# Patient Record
Sex: Female | Born: 1995 | Race: White | Hispanic: No | Marital: Single | Smoking: Never smoker
Health system: Southern US, Community
[De-identification: ages and names within clinical notes are randomized; demographics above are authoritative.]

## PROBLEM LIST (undated history)

## (undated) ENCOUNTER — Emergency Department: Admission: EM | Payer: Self-pay

## (undated) DIAGNOSIS — G43909 Migraine, unspecified, not intractable, without status migrainosus: Secondary | ICD-10-CM

## (undated) HISTORY — PX: NO PAST SURGERIES: SHX2092

## (undated) HISTORY — DX: Migraine, unspecified, not intractable, without status migrainosus: G43.909

---

## 2017-04-07 ENCOUNTER — Encounter: Payer: Self-pay | Admitting: Emergency Medicine

## 2017-04-07 ENCOUNTER — Emergency Department
Admission: EM | Admit: 2017-04-07 | Discharge: 2017-04-07 | Disposition: A | Discharge status: Home / Self Care | Payer: Self-pay | Attending: Emergency Medicine | Admitting: Emergency Medicine

## 2017-04-07 DIAGNOSIS — N10 Acute tubulo-interstitial nephritis: Secondary | ICD-10-CM | POA: Insufficient documentation

## 2017-04-07 LAB — URINALYSIS, ROUTINE W REFLEX MICROSCOPIC
Bilirubin Urine: NEGATIVE
Glucose, UA: NEGATIVE mg/dL
Hgb urine dipstick: NEGATIVE
KETONES UR: 20 mg/dL — AB
Nitrite: NEGATIVE
Protein, ur: 30 mg/dL — AB
Specific Gravity, Urine: 1.013 (ref 1.005–1.030)
pH: 6 (ref 5.0–8.0)

## 2017-04-07 MED ORDER — PHENAZOPYRIDINE HCL 95 MG PO TABS: 95.0000 mg | ORAL_TABLET | Freq: Three times a day (TID) | ORAL | 0 refills | Status: DC | PRN

## 2017-04-07 MED ORDER — CEFTRIAXONE SODIUM 1 G IJ SOLR
1.0000 g | Freq: Once | INTRAMUSCULAR | Status: AC
  Administered 2017-04-07: 1 g via INTRAMUSCULAR

## 2017-04-07 MED ORDER — CEFTRIAXONE SODIUM 1 G IJ SOLR
INTRAMUSCULAR | Status: AC
  Filled 2017-04-07: qty 10

## 2017-04-07 MED ORDER — CEPHALEXIN 500 MG PO CAPS: 500.0000 mg | ORAL_CAPSULE | Freq: Four times a day (QID) | ORAL | 0 refills | Status: DC

## 2017-04-07 NOTE — ED Provider Notes (Signed)
[facility] Emergency Department Provider Note  ____________________________________________  Time seen: Approximately 3:44 PM  I have reviewed the triage vital signs and the nursing notes.   HISTORY  Chief Complaint Urinary Tract Infection    HPI Maria Castillo is a 21 y.o. female who presents emergency department complaining of dysuria and hematuria. Patient reports that she has had symptoms for the past 7-10 days. She reports she has a history of UTIs in the past and states this feels the same. Patient reports suprapubic pain with urination and occasional intermittent bilateral flank pain. Patient reports that she has seen some blood in her urine. No history kidney stone. No history of diabetes. Patient is not trying medications for this complaint. No other complaints at this time.   History reviewed. No pertinent past medical history.  There are no active problems to display for this patient.   History reviewed. No pertinent surgical history.  Prior to Admission medications   Medication Sig Start Date End Date Taking? Authorizing Provider  cephALEXin (KEFLEX) 500 MG capsule Take 1 capsule (500 mg total) by mouth 4 (four) times daily. 04/07/17   Cuthriell, Delorise RoyalsJonathan D, PA-C  phenazopyridine (PYRIDIUM) 95 MG tablet Take 1 tablet (95 mg total) by mouth 3 (three) times daily as needed for pain. 04/07/17   Cuthriell, Delorise RoyalsJonathan D, PA-C    Allergies Patient has no known allergies.  No family history on file.  Social History Social History  Substance Use Topics  . Smoking status: Never Smoker  . Smokeless tobacco: Never Used  . Alcohol use No     Review of Systems  Constitutional: No fever/chills Eyes: No visual changes. No discharge ENT: No upper respiratory complaints. Cardiovascular: no chest pain. Respiratory: no cough. No SOB. Gastrointestinal: No abdominal pain.  No nausea, no vomiting.  No diarrhea.  No constipation. Genitourinary:  Positive for dysuria. Positive hematuria. Positive for suprapubic pain. Positive for intermittent flank pain. Musculoskeletal: Negative for musculoskeletal pain. Skin: Negative for rash, abrasions, lacerations, ecchymosis. Neurological: Negative for headaches, focal weakness or numbness. 10-point ROS otherwise negative.  ____________________________________________   PHYSICAL EXAM:  VITAL SIGNS: ED Triage Vitals [04/07/17 1442]  Enc Vitals Group     BP 137/80     Pulse Rate 98     Resp 18     Temp 98.2 F (36.8 C)     Temp Source Oral     SpO2 100 %     Weight 120 lb (54.4 kg)     Height 5\' 2"  (1.575 m)     Head Circumference      Peak Flow      Pain Score 6     Pain Loc      Pain Edu?      Excl. in GC?      Constitutional: Alert and oriented. Well appearing and in no acute distress. Eyes: Conjunctivae are normal. PERRL. EOMI. Head: Atraumatic. Neck: No stridor.    Cardiovascular: Normal rate, regular rhythm. Normal S1 and S2.  Good peripheral circulation. Respiratory: Normal respiratory effort without tachypnea or retractions. Lungs CTAB. Good air entry to the bases with no decreased or absent breath sounds. Gastrointestinal: Bowel sounds 4 quadrants. Soft to palpation.Patient has mild tenderness to palpation in suprapubic region, no other tenderness to palpation. No guarding or rigidity. No palpable masses. No distention. No CVA tenderness. Musculoskeletal: Full range of motion to all extremities. No gross deformities appreciated. Neurologic:  Normal speech and language. No gross focal neurologic deficits are  appreciated.  Skin:  Skin is warm, dry and intact. No rash noted. Psychiatric: Mood and affect are normal. Speech and behavior are normal. Patient exhibits appropriate insight and judgement.   ____________________________________________   LABS (all labs ordered are listed, but only abnormal results are displayed)  Labs Reviewed  URINALYSIS, ROUTINE W REFLEX  MICROSCOPIC - Abnormal; Notable for the following:       Result Value   Color, Urine YELLOW (*)    APPearance HAZY (*)    Ketones, ur 20 (*)    Protein, ur 30 (*)    Leukocytes, UA SMALL (*)    Bacteria, UA RARE (*)    Squamous Epithelial / LPF 6-30 (*)    All other components within normal limits   ____________________________________________  EKG   ____________________________________________  RADIOLOGY   No results found.  ____________________________________________    PROCEDURES  Procedure(s) performed:    Procedures    Medications  cefTRIAXone (ROCEPHIN) injection 1 g (not administered)     ____________________________________________   INITIAL IMPRESSION / ASSESSMENT AND PLAN / ED COURSE  Pertinent labs & imaging results that were available during my care of the patient were reviewed by me and considered in my medical decision making (see chart for details).  Review of the [place] CSRS was performed in accordance of the [facility] prior to dispensing any controlled drugs.     Patient's diagnosis is consistent with Pyelonephritis. Patient has had UTI symptoms 7-10 days. Patient reports intermittent flank pain with hematuria. Urinalysis returns with leukocytes and mild proteinuria and ketones. Patient denies any history of diabetes. No hematuria on urinalysis. On exam, patient is monitored palpation in the suprapubic region. She reports some mild discomfort intermittently in the flank region but this is not clinically correlated with CVA tenderness. Due to the length of symptoms, reported intermittent bilateral flank pain, patient will be diagnosed with pyelonephritis and will be treated with a gram of Rocephin in the emergency department and discharged home with oral antibiotics.. Patient will be discharged home with prescriptions for Keflex. Patient is to follow up with primary care after finishing oral antibiotics for repeat urinalysis to ensure clearance of ketones  and protein after UTI symptoms have resolved.  Patient is given ED precautions to return to the ED for any worsening or new symptoms.     ____________________________________________  FINAL CLINICAL IMPRESSION(S) / ED DIAGNOSES  Final diagnoses:  Acute pyelonephritis      NEW MEDICATIONS STARTED DURING THIS VISIT:  New Prescriptions   CEPHALEXIN (KEFLEX) 500 MG CAPSULE    Take 1 capsule (500 mg total) by mouth 4 (four) times daily.   PHENAZOPYRIDINE (PYRIDIUM) 95 MG TABLET    Take 1 tablet (95 mg total) by mouth 3 (three) times daily as needed for pain.        This chart was dictated using voice recognition software/Dragon. Despite best efforts to proofread, errors can occur which can change the meaning. Any change was purely unintentional.    Racheal Patches, PA-C 04/07/17 1621    Jeanmarie Plant, MD 04/07/17 2154

## 2017-04-07 NOTE — ED Triage Notes (Signed)
Burning with urination and lower abd pain with urination, has seen blood in her urine, has hx of uti, states this feels the same.

## 2017-04-07 NOTE — ED Notes (Signed)
Patient presents to the ED with lower back pain after having dysuria x 2 weeks.  Patient denies taking any antibiotics.  Patient is in no obvious distress at this time.  Patient reports one episode of vomiting yesterday and one episode of diarrhea daily for the past several days.

## 2017-11-03 ENCOUNTER — Other Ambulatory Visit: Payer: Self-pay | Admitting: Unknown Physician Specialty

## 2017-11-03 DIAGNOSIS — H9122 Sudden idiopathic hearing loss, left ear: Secondary | ICD-10-CM

## 2017-11-10 ENCOUNTER — Ambulatory Visit: Payer: BLUE CROSS/BLUE SHIELD

## 2017-11-19 ENCOUNTER — Other Ambulatory Visit: Payer: Self-pay | Admitting: Otolaryngology

## 2017-11-19 DIAGNOSIS — H9042 Sensorineural hearing loss, unilateral, left ear, with unrestricted hearing on the contralateral side: Secondary | ICD-10-CM

## 2017-11-19 DIAGNOSIS — IMO0001 Reserved for inherently not codable concepts without codable children: Secondary | ICD-10-CM

## 2017-11-24 ENCOUNTER — Ambulatory Visit
Admission: RE | Admit: 2017-11-24 | Discharge: 2017-11-24 | Disposition: A | Discharge status: Home / Self Care | Payer: BLUE CROSS/BLUE SHIELD | Source: Ambulatory Visit | Attending: Otolaryngology | Admitting: Otolaryngology

## 2017-11-24 DIAGNOSIS — IMO0001 Reserved for inherently not codable concepts without codable children: Secondary | ICD-10-CM

## 2017-11-24 DIAGNOSIS — H9042 Sensorineural hearing loss, unilateral, left ear, with unrestricted hearing on the contralateral side: Secondary | ICD-10-CM | POA: Insufficient documentation

## 2017-11-24 MED ORDER — GADOBENATE DIMEGLUMINE 529 MG/ML IV SOLN
10.0000 mL | Freq: Once | INTRAVENOUS | Status: AC | PRN
  Administered 2017-11-24: 10 mL via INTRAVENOUS

## 2018-09-03 LAB — HIV ANTIBODY (ROUTINE TESTING W REFLEX): HIV 1&2 Ab, 4th Generation: NONREACTIVE

## 2019-01-08 IMAGING — MR MR BRAIN/IAC WO/W
10 of 12 series · 40 of 48 positions shown · IV contrast (10 ML MULTIHANCE)
Comparison: None.

CLINICAL DATA: Left-sided hearing loss for 4 months.

EXAM:
MRI HEAD WITHOUT AND WITH CONTRAST
TECHNIQUE: Multiplanar, multiecho pulse sequences of the brain and surrounding
structures were obtained without and with intravenous contrast.
CONTRAST:  10mL MULTIHANCE GADOBENATE DIMEGLUMINE 529 MG/ML IV SOLN

[Series 2: T1 · sagittal · 5.0mm · 0.45mm/px · 4 of 23 slices shown (1 of 2)]
[im 1/23]
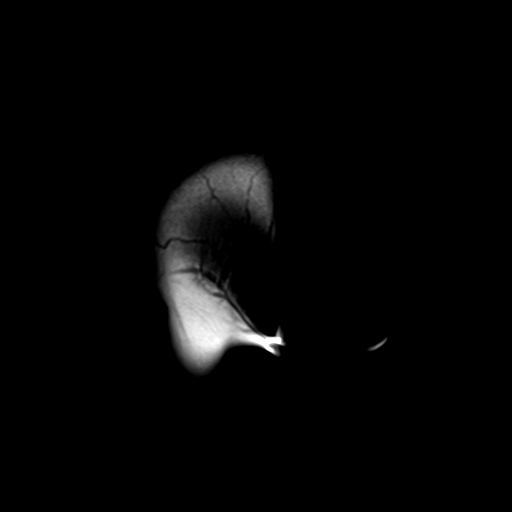
[im 8/23]
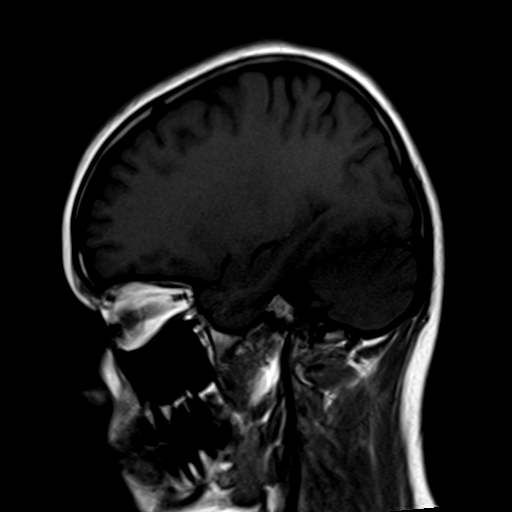
[im 15/23]
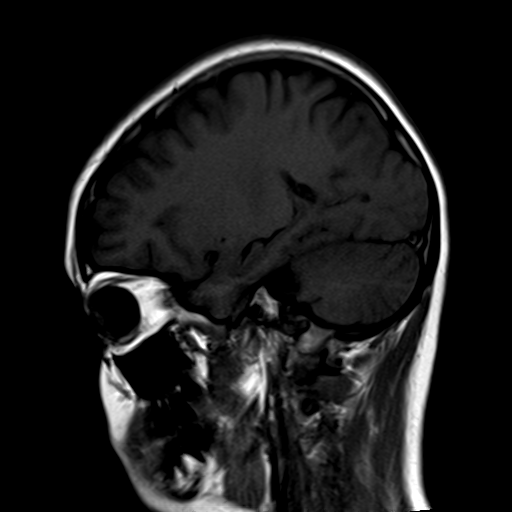
[im 23/23]
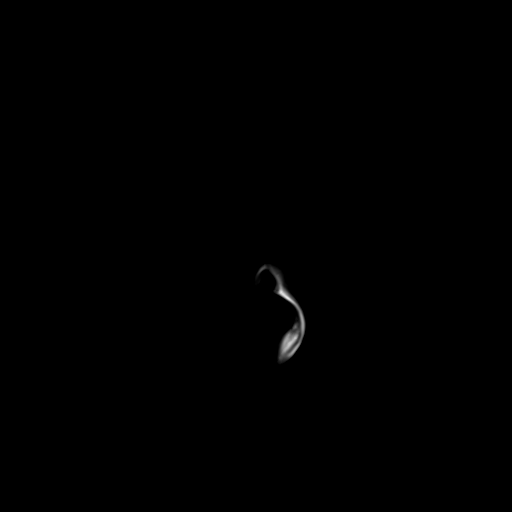

[Series 4: DWI · axial · 3.0mm · 1.20mm/px · z∈[-58,+99]mm · 6 of 54 slices shown (1 of 2)]
[im 1/54]
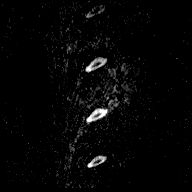
[im 11/54]
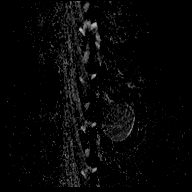
[im 22/54]
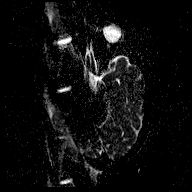
[im 32/54]
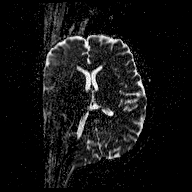
[im 43/54]
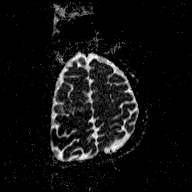
[im 54/54]
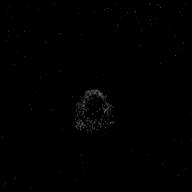

[Series 5: T2 · axial · 5.0mm · 0.72mm/px · z∈[-57,+99]mm · 3 of 24 slices shown (1 of 2)]
[im 1/24]
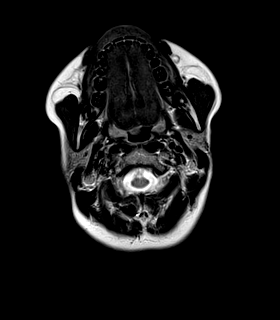
[im 12/24]
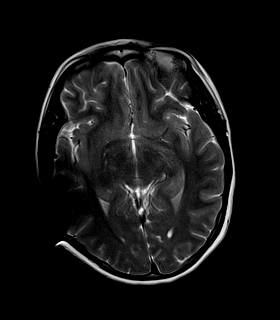
[im 24/24]
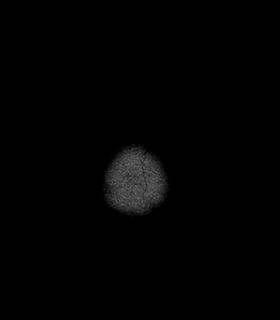

[Series 6: FLAIR · axial · 3.0mm · 0.45mm/px · z∈[-58,+99]mm · 7 of 55 slices shown]
[im 1/55]
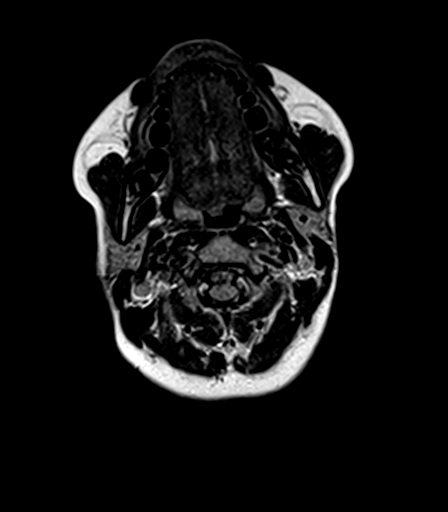
[im 10/55]
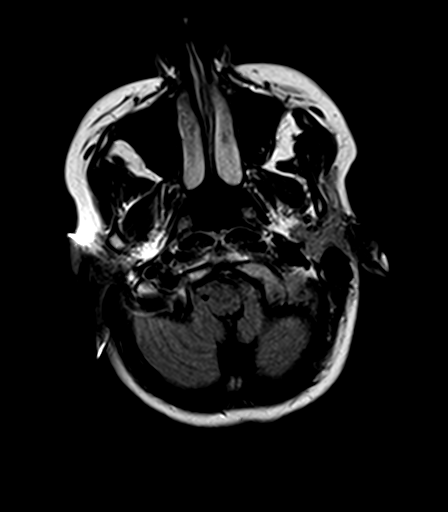
[im 19/55]
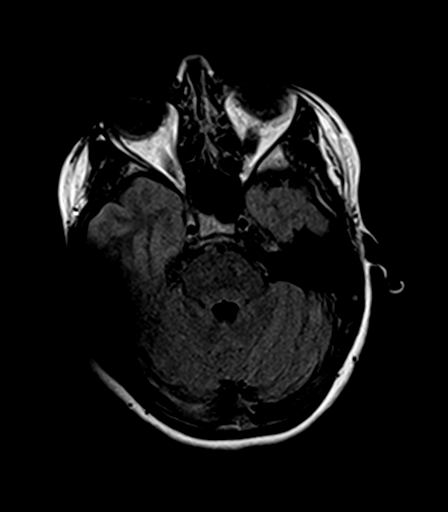
[im 28/55]
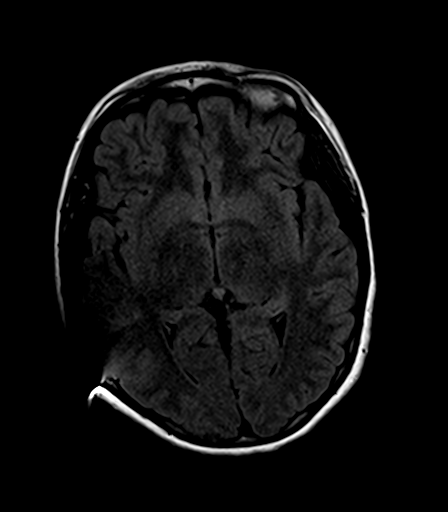
[im 37/55]
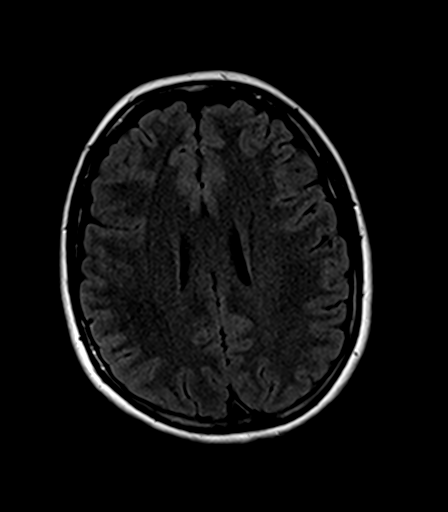
[im 46/55]
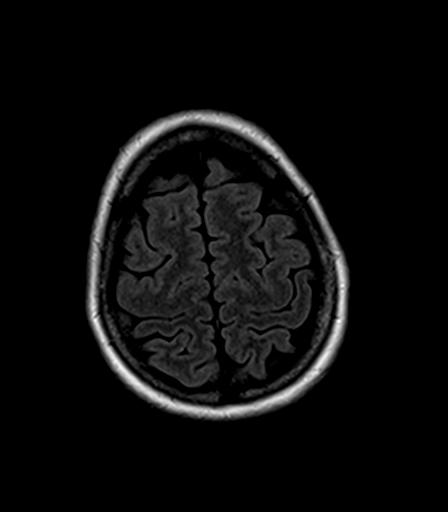
[im 55/55]
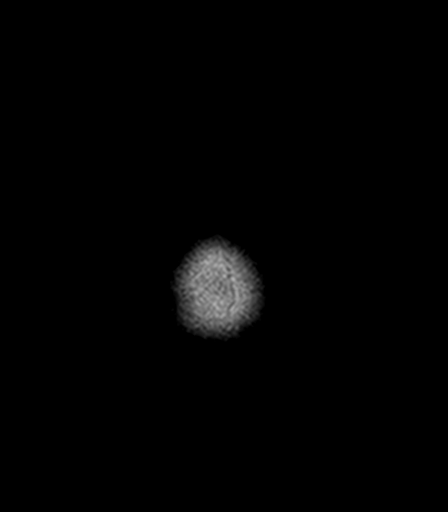

[Series 7: T2 · axial · 5.0mm · 0.72mm/px · z∈[-57,+99]mm · 3 of 24 slices shown (2 of 2)]
[im 1/24]
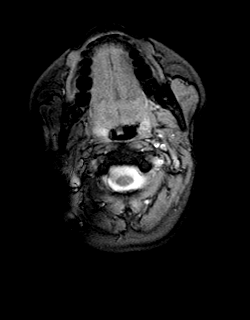
[im 12/24]
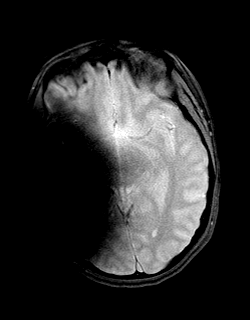
[im 24/24]
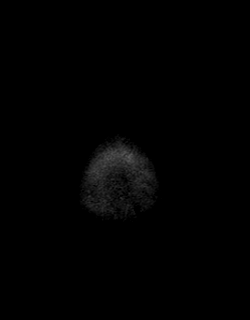

[Series 8: T1 · coronal · 3.0mm · 0.37mm/px · 1 of 11 slices shown (2 of 2)]
[im 1/11]
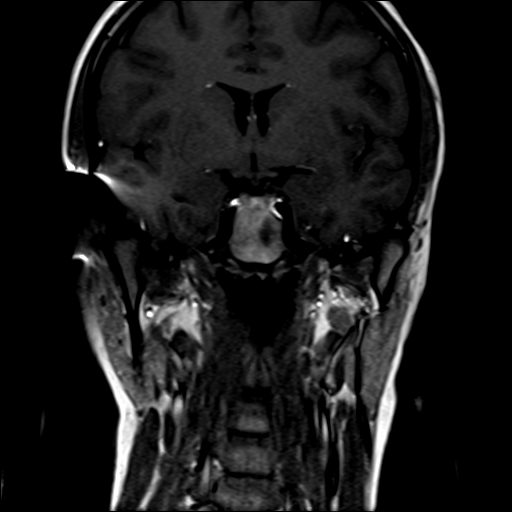

[Series 11: T1 post-contrast · axial · 3.0mm · 0.37mm/px · 1 of 11 slices shown (1 of 3)]
[im 1/11]
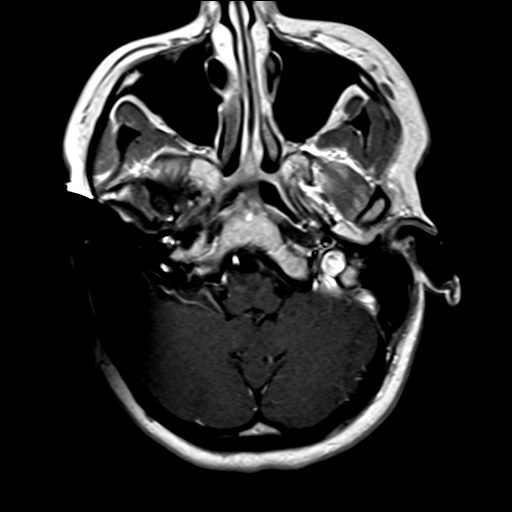

[Series 12: T1 post-contrast · coronal · 3.0mm · 0.37mm/px · 1 of 11 slices shown (2 of 3)]
[im 1/11]
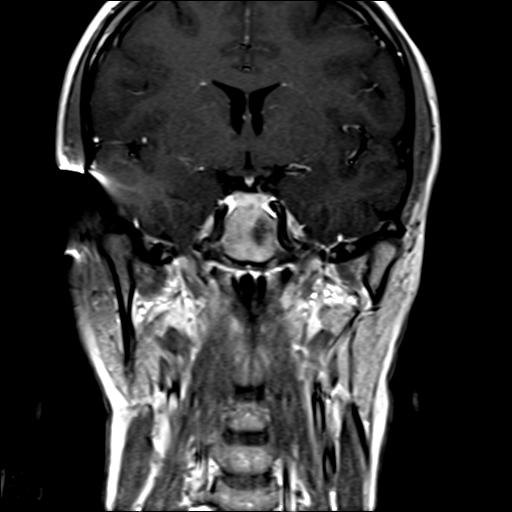

[Series 13: T1 post-contrast · axial · 3.0mm · 1.00mm/px · z∈[-74,+109]mm · 8 of 64 slices shown (3 of 3)]
[im 1/64]
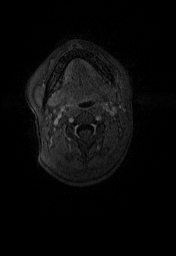
[im 10/64]
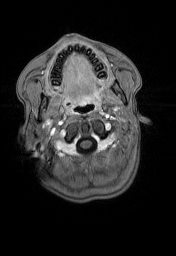
[im 19/64]
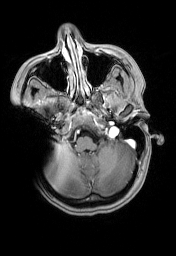
[im 28/64]
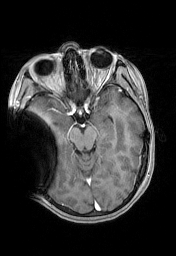
[im 37/64]
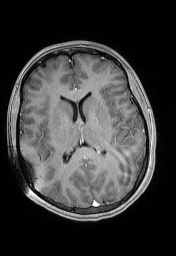
[im 46/64]
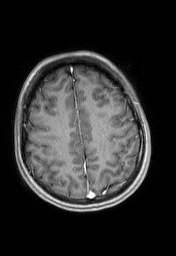
[im 55/64]
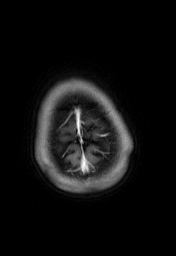
[im 64/64]
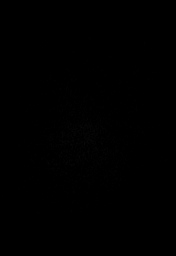

[Series 100: DWI · axial · 3.0mm · 1.20mm/px · z∈[-58,+99]mm · 6 of 53 slices shown (2 of 2)]
[im 1/53]
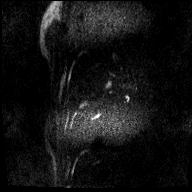
[im 11/53]
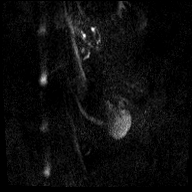
[im 21/53]
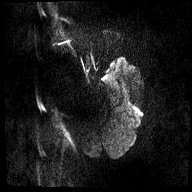
[im 32/53]
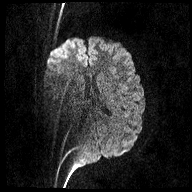
[im 42/53]
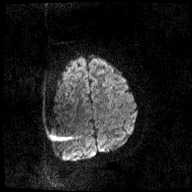
[im 53/53]
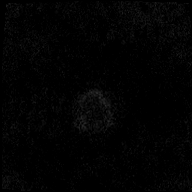

[40 of 48 positions shown; findings below may reference images not displayed]

FINDINGS: Brain: There is pervasive artifact on the right aspect of the scan
from a piercing that was reportedly unable to be removed. The right
temporal bone is not evaluated.

No explanation for left-sided hearing loss. Normal appearance of the
brainstem, cisterns, left vestibulocochlear nerves, and left
labyrinth. No enlargement of the left endolymphatic sac. No evidence
of demyelinating disease.

No infarct, blood products, hydrocephalus, or masslike finding.

Vascular: Major flow voids and vascular enhancements are preserved.

Skull and upper cervical spine: No evidence of marrow lesion.

Sinuses/Orbits: Negative
IMPRESSION: 1. Negative brain MRI. No explanation for left sensorineural hearing
loss.
2. Persistent artifact and obscuration involving the right aspect of
the scan due to a non removable piercing.

## 2019-04-29 ENCOUNTER — Ambulatory Visit: Payer: BLUE CROSS/BLUE SHIELD

## 2019-05-06 ENCOUNTER — Ambulatory Visit: Payer: Self-pay

## 2019-05-13 ENCOUNTER — Encounter: Payer: Self-pay | Admitting: Physician Assistant

## 2019-05-13 ENCOUNTER — Other Ambulatory Visit: Payer: Self-pay

## 2019-05-13 ENCOUNTER — Ambulatory Visit: Payer: Self-pay | Admitting: Physician Assistant

## 2019-05-13 DIAGNOSIS — Z113 Encounter for screening for infections with a predominantly sexual mode of transmission: Secondary | ICD-10-CM

## 2019-05-13 LAB — WET PREP FOR TRICH, YEAST, CLUE
Trichomonas Exam: NEGATIVE
Yeast Exam: NEGATIVE

## 2019-05-13 NOTE — Progress Notes (Signed)
Wet mount reviewed, no treatment indicated..Juwaun Inskeep Brewer-Jensen, RN 

## 2019-05-13 NOTE — Progress Notes (Signed)
    STI clinic/screening visit  Subjective:  Maria Castillo is a 23 y.o. female being seen today for an STI screening visit. The patient reports they do not have symptoms.  Patient has the following medical conditions:  There are no active problems to display for this patient.    Chief Complaint  Patient presents with  . SEXUALLY TRANSMITTED DISEASE    HPI  Patient reports that she does not have any symptoms but would like a screening today.   See flowsheet for further details and programmatic requirements.    The following portions of the patient's history were reviewed and updated as appropriate: allergies, current medications, past medical history, past social history, past surgical history and problem list.  Objective:  There were no vitals filed for this visit.  Physical Exam Constitutional:      General: She is not in acute distress.    Appearance: Normal appearance.  HENT:     Head: Normocephalic and atraumatic.     Mouth/Throat:     Mouth: Mucous membranes are moist.     Pharynx: Oropharynx is clear. No posterior oropharyngeal erythema.  Neck:     Musculoskeletal: Neck supple.  Pulmonary:     Effort: Pulmonary effort is normal.  Abdominal:     Palpations: Abdomen is soft. There is no mass.     Tenderness: There is no abdominal tenderness. There is no guarding or rebound.  Genitourinary:    General: Normal vulva.     Rectum: Normal.     Comments: External genitalia/pubic area normal female without nits, lice, edema, erythema, lesions and inguinal adenopathy. Vagina with normal mucosa and moderate amount of thin, menstrual bleeding present. Cervix without visible lesions. Uterus normal size, firm, mobile, nt, no masses, no CMT, no adnexal tenderness or fullness. Lymphadenopathy:     Cervical: No cervical adenopathy.  Skin:    General: Skin is warm.     Findings: No bruising, erythema, lesion or rash.  Neurological:     Mental Status: She is alert and  oriented to person, place, and time.  Psychiatric:        Mood and Affect: Mood normal.        Behavior: Behavior normal.        Thought Content: Thought content normal.        Judgment: Judgment normal.       Assessment and Plan:  Maria Castillo is a 23 y.o. female presenting to the [facility] Department for STI screening  1. Screening for STD (sexually transmitted disease) Patient is without symptoms today. Rec condoms with all sex Await test results.  Counseled that RN will call if she needs to RTC for any treatment once results are back.   - WET PREP FOR [place], YEAST, CLUE - Chlamydia/Gonorrhea [place] [facility] - HIV [place] [facility] - Syphilis Serology, [place] [facility] - Gonococcus culture     Return for prn.  No future appointments.  Jerene Dilling, PA

## 2019-05-13 NOTE — Progress Notes (Signed)
Patient here for STD testing. Using OC for BCM..Zakyia Gagan Brewer-Jensen, RN 

## 2019-05-18 LAB — GONOCOCCUS CULTURE

## 2019-11-18 ENCOUNTER — Ambulatory Visit: Payer: BC Managed Care – PPO | Admitting: Family Medicine

## 2019-11-18 ENCOUNTER — Ambulatory Visit: Payer: BC Managed Care – PPO

## 2019-11-18 ENCOUNTER — Other Ambulatory Visit: Payer: Self-pay

## 2019-11-18 ENCOUNTER — Other Ambulatory Visit: Payer: Self-pay | Admitting: Family Medicine

## 2019-11-18 ENCOUNTER — Encounter: Payer: Self-pay | Admitting: Family Medicine

## 2019-11-18 VITALS — BP 117/75 | Ht 62.75 in | Wt 126.0 lb

## 2019-11-18 DIAGNOSIS — Z3009 Encounter for other general counseling and advice on contraception: Secondary | ICD-10-CM

## 2019-11-18 DIAGNOSIS — G43909 Migraine, unspecified, not intractable, without status migrainosus: Secondary | ICD-10-CM | POA: Insufficient documentation

## 2019-11-18 DIAGNOSIS — Z113 Encounter for screening for infections with a predominantly sexual mode of transmission: Secondary | ICD-10-CM

## 2019-11-18 LAB — WET PREP FOR TRICH, YEAST, CLUE
Trichomonas Exam: NEGATIVE
Yeast Exam: NEGATIVE

## 2019-11-18 MED ORDER — NORETHINDRONE 0.35 MG PO TABS: 1.0000 | ORAL_TABLET | Freq: Every day | ORAL | 13 refills | Status: DC

## 2019-11-18 MED ORDER — NORETHINDRONE 0.35 MG PO TABS: 1.0000 | ORAL_TABLET | Freq: Every day | ORAL | 0 refills | Status: DC

## 2019-11-18 NOTE — Progress Notes (Signed)
Here today for FP/STD screen.  Needs pap and refill on Micronor. Richmond Campbell, RN

## 2019-11-18 NOTE — Progress Notes (Signed)
Family Planning Visit  Subjective:  Maria Castillo is a 24 y.o. being seen today for  Chief Complaint  Patient presents with  . Contraception  . SEXUALLY TRANSMITTED DISEASE    Screen    Pt has Migraines on their problem list.  HPI  Patient reports she would like refills of Micronor OCP. She stopped using for a few months and then restarted 3 wks ago, has missed no recent pills since restarting.   Pt denies any of the following contraindications to progesterone only contraceptive use: -Known or suspected pregnancy.  -Known or suspected breast cancer. -Undiagnosed abnormal uterine bleeding. -Benign or malignant liver tumors, severe cirrhosis, or acute liver disease. -Pt is also not taking anticonvulsants, nor has undergone malabsorptive bariatric surgeries.  Patient's last menstrual period was 11/11/2019 (approximate). Last sex: 2 wks ago BCM: micronor Pt desires EC? n/a  Last pap: never  Patient reports 2 partner(s) in last year. Do they desire STI screening (if no, why not)? yes  Does the patient desire a pregnancy in the next year? no   24 y.o., Body mass index is 22.5 kg/m. - Is patient eligible for HA1C diabetes screening based on BMI and age >72?  no  Does the patient have a current or past history of drug use? no No components found for: HCV  See flowsheet for other program required questions.   Health Maintenance Due  Topic Date Due  . TETANUS/TDAP  02/17/2017  . PAP-Cervical Cytology Screening  02/18/2019  . PAP SMEAR-Modifier  02/18/2019  . INFLUENZA VACCINE  05/21/2019    ROS  The following portions of the patient's history were reviewed and updated as appropriate: allergies, current medications, past family history, past medical history, past social history, past surgical history and problem list. Problem list updated.  Objective:  BP 117/75   Ht 5' 2.75" (1.594 m)   Wt 126 lb (57.2 kg)   LMP 11/11/2019 (Approximate)   BMI 22.50 kg/m     Physical Exam Vitals and nursing note reviewed.  Constitutional:      Appearance: Normal appearance.  HENT:     Head: Normocephalic and atraumatic.     Mouth/Throat:     Mouth: Mucous membranes are moist.     Pharynx: Oropharynx is clear. No oropharyngeal exudate or posterior oropharyngeal erythema.  Eyes:     Conjunctiva/sclera: Conjunctivae normal.  Cardiovascular:     Pulses: Normal pulses.  Pulmonary:     Effort: Pulmonary effort is normal.  Chest:     Breasts:        Right: Normal. No swelling, mass, nipple discharge, skin change or tenderness.        Left: Normal. No swelling, mass, nipple discharge, skin change or tenderness.  Abdominal:     General: Abdomen is flat.     Palpations: There is no mass.     Tenderness: There is no abdominal tenderness. There is no rebound.  Genitourinary:     General: Normal vulva.     Exam position: Lithotomy position.     Pubic Area: No rash or pubic lice.      Labia:        Right: No rash or lesion.        Left: No rash or lesion.      Vagina: Normal. No vaginal discharge, erythema, or lesions. +bright red period blood    Cervix: No cervical motion tenderness, discharge, friability, lesion or erythema.     Uterus: Normal.  Adnexa: Right adnexa normal and left adnexa normal.     Rectum: Normal.  Lymphadenopathy:     Head:     Right side of head: No preauricular or posterior auricular adenopathy.     Left side of head: No preauricular or posterior auricular adenopathy.     Cervical: No cervical adenopathy.     Upper Body:     Right upper body: No supraclavicular or axillary adenopathy.     Left upper body: No supraclavicular or axillary adenopathy.     Lower Body: No right inguinal adenopathy. No left inguinal adenopathy.  Skin:    General: Skin is warm and dry.     Findings: No rash.  Neurological:     Mental Status: She is alert and oriented to person, place, and time.      Assessment and Plan:  Maria Castillo  is a 24 y.o. female presenting to the [facility] Department for a well woman exam/family planning visit  Contraception counseling: Reviewed all forms of birth control options in the tiered based approach including abstinence; over the counter/barrier methods; hormonal contraceptive medication including pill, patch, ring, injection, contraceptive implant; hormonal and nonhormonal IUDs; permanent sterilization options including vasectomy and the various tubal sterilization modalities. Risks, benefits, how to discontinue and typical effectiveness rates were reviewed.  Questions were answered.  Written information was also given to the patient to review.  Patient desires OCP, this was prescribed for patient. She will follow up in  1 year for surveillance.  She was told to call with any further questions, or with any concerns about this method of contraception.  Emphasized use of condoms 100% of the time for STI prevention.  Emergency Contraception: n/a    1. Family planning services -Rx micronor x1 yr. Counseling as above.  -Pap today. She has never had a pap, no hx abnormal. - Pap IG ([facility]) - norethindrone (MICRONOR) 0.35 MG tablet; Take 1 tablet (0.35 mg total) by mouth daily.  Dispense: 13 Package; Refill: 0  2. Screening examination for venereal disease -Screenings today as below. Treat wet prep per standing order. -Patient does nto meet criteria for HepB, HepC Screening.  -Counseled on warning s/sx and when to seek care. Recommended condom use with all sex and discussed importance of condom use for STI prevention. - WET PREP FOR [place], YEAST, CLUE - HIV [place] [facility] - Syphilis Serology, [place] [facility] - Chlamydia/Gonorrhea [place] [facility]   Return in about 1 year (around 11/17/2020) for yearly wellness exam.  No future appointments.  Kandee Keen, PA-C

## 2019-11-18 NOTE — Progress Notes (Signed)
Wet mount reviewed. No tx per SO Diavian Furgason, RN  

## 2019-11-21 LAB — PAP IG (IMAGE GUIDED): PAP Smear Comment: 0

## 2020-04-06 ENCOUNTER — Ambulatory Visit: Payer: BC Managed Care – PPO

## 2020-04-13 ENCOUNTER — Other Ambulatory Visit: Payer: Self-pay

## 2020-04-13 ENCOUNTER — Ambulatory Visit: Payer: Self-pay | Admitting: Physician Assistant

## 2020-04-13 DIAGNOSIS — Z113 Encounter for screening for infections with a predominantly sexual mode of transmission: Secondary | ICD-10-CM

## 2020-04-13 LAB — WET PREP FOR TRICH, YEAST, CLUE
Trichomonas Exam: NEGATIVE
Yeast Exam: NEGATIVE

## 2020-04-13 NOTE — Progress Notes (Signed)
Wet Mount results reviewed. Per standing orders no treatment indicated. Mildreth Reek, RN  

## 2020-04-14 ENCOUNTER — Encounter: Payer: Self-pay | Admitting: Physician Assistant

## 2020-04-14 NOTE — Progress Notes (Signed)
[facility] Department STI clinic/screening visit  Subjective:  Maria Castillo is a 24 y.o. female being seen today for an STI screening visit. The patient reports they do not have symptoms.  Patient reports that they do not desire a pregnancy in the next year.   They reported they are not interested in discussing contraception today.  Patient's last menstrual period was 04/10/2020 (exact date).   Patient has the following medical conditions:   Patient Active Problem List   Diagnosis Date Noted  . Migraines 11/18/2019    Chief Complaint  Patient presents with  . SEXUALLY TRANSMITTED DISEASE    screening    HPI  Patient reports that she does not have any symptoms but would like a screening today.  Reports that she has migraine headaches that are without changes, has had no surgeries, takes MVI and OCs regularly, had her last pap and HIV testing 12/2019.  See flowsheet for further details and programmatic requirements.    The following portions of the patient's history were reviewed and updated as appropriate: allergies, current medications, past medical history, past social history, past surgical history and problem list.  Objective:  There were no vitals filed for this visit.  Physical Exam Constitutional:      General: She is not in acute distress.    Appearance: Normal appearance.  HENT:     Head: Normocephalic and atraumatic.     Comments: No nits, lice, or hair loss. No cervical, supraclavicular or axillary adenopathy.    Mouth/Throat:     Mouth: Mucous membranes are moist.     Pharynx: Oropharynx is clear. No oropharyngeal exudate or posterior oropharyngeal erythema.  Eyes:     Conjunctiva/sclera: Conjunctivae normal.  Pulmonary:     Effort: Pulmonary effort is normal.  Abdominal:     Palpations: Abdomen is soft. There is no mass.     Tenderness: There is no abdominal tenderness. There is no guarding or rebound.  Genitourinary:    General: Normal  vulva.     Rectum: Normal.     Comments: External genitalia/pubic area without nits, lice, edema, erythema, lesions and inguinal adenopathy. Vagina with normal mucosa and small amount of white discharge, pH=4.5. Cervix without visible lesions. Uterus firm, mobile, nt, no masses, no CMT, no adnexal tenderness or fullness. Musculoskeletal:     Cervical back: Neck supple. No tenderness.  Skin:    General: Skin is warm and dry.     Findings: No bruising, erythema, lesion or rash.  Neurological:     Mental Status: She is alert and oriented to person, place, and time.  Psychiatric:        Mood and Affect: Mood normal.        Behavior: Behavior normal.        Thought Content: Thought content normal.        Judgment: Judgment normal.      Assessment and Plan:  Maria Castillo is a 24 y.o. female presenting to the [facility] Department for STI screening  1. Screening for STD (sexually transmitted disease) Patient into clinic without symptoms. Rec condoms with all sex. Await test results.  Counseled that RN will call if needs to RTC for treatment once results are back. - WET PREP FOR [place], YEAST, CLUE - Gonococcus culture - Chlamydia/Gonorrhea [place] [facility] - HIV [place] [facility] - Syphilis Serology, [place] [facility]     No follow-ups on file.  No future appointments.  Matt Holmes, PA

## 2020-04-17 LAB — GONOCOCCUS CULTURE

## 2020-08-03 ENCOUNTER — Other Ambulatory Visit: Payer: Self-pay

## 2020-08-03 ENCOUNTER — Ambulatory Visit: Payer: Self-pay | Admitting: Family Medicine

## 2020-08-03 ENCOUNTER — Encounter: Payer: Self-pay | Admitting: Family Medicine

## 2020-08-03 DIAGNOSIS — Z113 Encounter for screening for infections with a predominantly sexual mode of transmission: Secondary | ICD-10-CM

## 2020-08-03 DIAGNOSIS — Z23 Encounter for immunization: Secondary | ICD-10-CM

## 2020-08-03 LAB — WET PREP FOR TRICH, YEAST, CLUE
Trichomonas Exam: NEGATIVE
Yeast Exam: NEGATIVE

## 2020-08-03 NOTE — Progress Notes (Signed)
[employer] results reviewed. Per standing orders no treatment indicated. Tdap given and tolerated well. Tawny Hopping, RN

## 2020-08-03 NOTE — Progress Notes (Signed)
  [facility] Department STI clinic/screening visit  Subjective:  Maria Castillo is a 24 y.o. female being seen today for  Chief Complaint  Patient presents with  . SEXUALLY TRANSMITTED DISEASE     The patient reports they do not have symptoms. Patient reports that they are not desire a pregnancy in the next year. They reported they are not interested in discussing contraception today.   Patient has the following medical conditions:   Patient Active Problem List   Diagnosis Date Noted  . Migraines 11/18/2019    HPI  Pt reports she is here for STI screening. Denies symptoms.  See flowsheet for further details and programmatic requirements.    No LMP recorded. Last sex: 1 day ago BCM: micronor Desires EC? n/a  Last pap per pt/review of record: 11/18/19 = NIL Last HIV test per pt/review of record: 03/2020  Last tetanus vaccine: 2010, agrees today Flu vaccine: declines   No components found for: HCV  The following portions of the patient's history were reviewed and updated as appropriate: allergies, current medications, past medical history, past social history, past surgical history and problem list.  Objective:  There were no vitals filed for this visit.   Physical Exam Vitals and nursing note reviewed.  Constitutional:      Appearance: Normal appearance.  HENT:     Head: Normocephalic and atraumatic.     Mouth/Throat:     Mouth: Mucous membranes are moist.     Pharynx: Oropharynx is clear. No oropharyngeal exudate or posterior oropharyngeal erythema.  Pulmonary:     Effort: Pulmonary effort is normal.  Abdominal:     General: Abdomen is flat.     Palpations: There is no mass.     Tenderness: There is no abdominal tenderness. There is no rebound.  Genitourinary:    Comments: Declines pelvic exam, prefers to self collect.  Lymphadenopathy:     Head:     Right side of head: No preauricular or posterior auricular adenopathy.     Left side of head:  No preauricular or posterior auricular adenopathy.     Cervical: No cervical adenopathy.     Upper Body:     Right upper body: No supraclavicular or axillary adenopathy.     Left upper body: No supraclavicular or axillary adenopathy.  Skin:    General: Skin is warm and dry.     Findings: No rash.  Neurological:     Mental Status: She is alert and oriented to person, place, and time.     Assessment and Plan:  Maria Castillo is a 24 y.o. female presenting to the [facility] Department for STI screening   1. Screening examination for venereal disease -Pt without symptoms. Screenings today as below. Treat wet prep per standing order. -Patient does not meet criteria for HepB, HepC Screening.  -Counseled on warning s/sx and when to seek care. Recommended condom use with all sex and discussed importance of condom use for STI prevention. - WET PREP FOR TRICH, YEAST, CLUE - Chlamydia/Gonorrhea [place] [facility] - HIV [place] [facility] - Syphilis Serology, [place] [facility] - Gonococcus culture  Pt requests tetanus vaccine today. RN notified, see separate documentation.   Return for screening as needed.  No future appointments.  Ann Held, PA-C

## 2020-08-07 LAB — GONOCOCCUS CULTURE

## 2020-10-04 ENCOUNTER — Ambulatory Visit: Payer: Self-pay

## 2020-10-05 ENCOUNTER — Ambulatory Visit: Payer: Self-pay

## 2020-10-10 ENCOUNTER — Encounter: Payer: Self-pay | Admitting: Physician Assistant

## 2020-10-10 ENCOUNTER — Ambulatory Visit: Payer: Self-pay | Admitting: Physician Assistant

## 2020-10-10 ENCOUNTER — Other Ambulatory Visit: Payer: Self-pay

## 2020-10-10 DIAGNOSIS — Z113 Encounter for screening for infections with a predominantly sexual mode of transmission: Secondary | ICD-10-CM

## 2020-10-10 LAB — WET PREP FOR TRICH, YEAST, CLUE
Trichomonas Exam: NEGATIVE
Yeast Exam: NEGATIVE

## 2020-10-10 NOTE — Progress Notes (Signed)
  [facility] Department STI clinic/screening visit  Subjective:  Maria Castillo is a 24 y.o. female being seen today for an STI screening visit. The patient reports they do not have symptoms.  Patient reports that they do not desire a pregnancy in the next year.   They reported they are not interested in discussing contraception today.  Patient's last menstrual period was 10/02/2020.   Patient has the following medical conditions:   Patient Active Problem List   Diagnosis Date Noted  . Migraines 11/18/2019    Chief Complaint  Patient presents with  . SEXUALLY TRANSMITTED DISEASE    screening    HPI  Patient reports that she is not having any symptoms but would like a screening today.  Denies chronic conditions and any surgeries.  Reports that she is using OCs as BCM, last HIV test was in 07/2020, and last pap was 10/2019.   See flowsheet for further details and programmatic requirements.    The following portions of the patient's history were reviewed and updated as appropriate: allergies, current medications, past medical history, past social history, past surgical history and problem list.  Objective:  There were no vitals filed for this visit.  Physical Exam Constitutional:      General: She is not in acute distress.    Appearance: Normal appearance.  HENT:     Head: Normocephalic and atraumatic.     Comments: No nits,lice, or hair loss. No cervical, supraclavicular or axillary adenopathy.    Mouth/Throat:     Mouth: Mucous membranes are moist.     Pharynx: Oropharynx is clear. No oropharyngeal exudate or posterior oropharyngeal erythema.  Eyes:     Conjunctiva/sclera: Conjunctivae normal.  Pulmonary:     Effort: Pulmonary effort is normal.  Musculoskeletal:     Cervical back: Neck supple. No tenderness.  Skin:    General: Skin is warm and dry.     Findings: No bruising, erythema, lesion or rash.  Neurological:     Mental Status: She is alert and  oriented to person, place, and time.  Psychiatric:        Mood and Affect: Mood normal.        Behavior: Behavior normal.        Thought Content: Thought content normal.        Judgment: Judgment normal.      Assessment and Plan:  Maria Castillo is a 24 y.o. female presenting to the [facility] Department for STI screening  1. Screening for STD (sexually transmitted disease) Patient into clinic without symptoms. Patient opts to self-collect vaginal samples for testing.  Counseled how to collect for accurate results.  Rec condoms with all sex. Await test results.  Counseled that RN will call if needs to RTC for treatment once results are back. - WET PREP FOR TRICH, YEAST, CLUE - Gonococcus culture - Chlamydia/Gonorrhea [place] [facility] - HIV [place] [facility] - Syphilis Serology, [place] [facility]     No follow-ups on file.  No future appointments.  Matt Holmes, PA

## 2020-10-10 NOTE — Progress Notes (Signed)
Post: °RN reviewed wet mount with patient. No tx needed per S.O. Provider orders complete.  ° °Elsie Baynes, RN ° °

## 2020-10-15 LAB — GONOCOCCUS CULTURE

## 2021-01-23 ENCOUNTER — Other Ambulatory Visit: Payer: Self-pay | Admitting: Family Medicine

## 2021-01-23 DIAGNOSIS — Z3009 Encounter for other general counseling and advice on contraception: Secondary | ICD-10-CM

## 2021-02-05 ENCOUNTER — Telehealth: Payer: Self-pay | Admitting: Family Medicine

## 2021-02-05 NOTE — Telephone Encounter (Signed)
Call to client who states needs prescription for additional ocps. Last physical at [facility] 10/2019. Client states will take last pill on Friday 02/08/2021 and period will start this weekend. Needs Thursday appt for physical as off day from work. No available appt 02/07/21 and appt scheduled for 02/14/2021 (scheduled in STI/FP slot). Jossie Ng, RN

## 2021-02-05 NOTE — Telephone Encounter (Signed)
Please call me in ref to my birth control.

## 2021-02-14 ENCOUNTER — Encounter: Payer: Self-pay | Admitting: Physician Assistant

## 2021-02-14 ENCOUNTER — Other Ambulatory Visit: Payer: Self-pay

## 2021-02-14 ENCOUNTER — Ambulatory Visit (LOCAL_COMMUNITY_HEALTH_CENTER): Payer: Self-pay | Admitting: Physician Assistant

## 2021-02-14 VITALS — BP 115/75 | Ht 62.0 in | Wt 142.0 lb

## 2021-02-14 DIAGNOSIS — Z3041 Encounter for surveillance of contraceptive pills: Secondary | ICD-10-CM

## 2021-02-14 DIAGNOSIS — Z113 Encounter for screening for infections with a predominantly sexual mode of transmission: Secondary | ICD-10-CM

## 2021-02-14 DIAGNOSIS — Z3009 Encounter for other general counseling and advice on contraception: Secondary | ICD-10-CM

## 2021-02-14 DIAGNOSIS — Z0000 Encounter for general adult medical examination without abnormal findings: Secondary | ICD-10-CM

## 2021-02-14 MED ORDER — NORETHINDRONE 0.35 MG PO TABS: 1.0000 | ORAL_TABLET | Freq: Every day | ORAL | 13 refills | Status: DC

## 2021-02-15 ENCOUNTER — Encounter: Payer: Self-pay | Admitting: Physician Assistant

## 2021-02-15 NOTE — Progress Notes (Signed)
Family Planning Visit- Repeat Yearly Visit  Subjective:  Maria Castillo is a 25 y.o. G0P0000  being seen today for an annual wellness visit and to discuss contraception options.   The patient is currently using Progesterone only pills for pregnancy prevention. Patient does not want a pregnancy in the next year. Patient has the following medical problems: has Migraines on their problem list.  Chief Complaint  Patient presents with  . Contraception    Annual exam and BCM    Patient reports that she is doing well with the current OC and has spotting 2-3 times a month.  Denies and increase or changes in her headaches and is followed by her PCP.  Per chart review, CBE due today and pap is due in 2024.  Patient denies any concerns today.    See flowsheet for other program required questions.   Body mass index is 25.97 kg/m. - Patient is eligible for diabetes screening based on BMI and age >55?  not applicable HA1C ordered? not applicable  Patient reports 3 of partners in last year. Desires STI screening?  Yes   Has patient been screened once for HCV in the past?  No  No results found for: HCVAB  Does the patient have current of drug use, have a partner with drug use, and/or has been incarcerated since last result? No  If yes-- Screen for HCV through [facility]   Does the patient meet criteria for HBV testing? No  Criteria:  -Household, sexual or needle sharing contact with HBV -History of drug use -HIV positive -Those with known Hep C   Health Maintenance Due  Topic Date Due  . Hepatitis C Screening  Never done  . COVID-19 Vaccine (1) Never done  . HPV VACCINES (1 - 2-dose series) Never done    Review of Systems  All other systems reviewed and are negative.   The following portions of the patient's history were reviewed and updated as appropriate: allergies, current medications, past family history, past medical history, past social history, past surgical history  and problem list. Problem list updated.  Objective:   Vitals:   02/14/21 0902  BP: 115/75  Weight: 142 lb (64.4 kg)  Height: 5\' 2"  (1.575 m)    Physical Exam Vitals and nursing note reviewed.  Constitutional:      General: She is not in acute distress.    Appearance: Normal appearance.  HENT:     Head: Normocephalic and atraumatic.     Mouth/Throat:     Mouth: Mucous membranes are moist.     Pharynx: Oropharynx is clear. No oropharyngeal exudate or posterior oropharyngeal erythema.  Eyes:     Conjunctiva/sclera: Conjunctivae normal.  Neck:     Thyroid: No thyroid mass, thyromegaly or thyroid tenderness.  Cardiovascular:     Rate and Rhythm: Normal rate and regular rhythm.  Pulmonary:     Effort: Pulmonary effort is normal.  Chest:  Breasts:     Right: Normal. No mass, nipple discharge, skin change, tenderness, axillary adenopathy or supraclavicular adenopathy.     Left: Normal. No mass, nipple discharge, skin change, tenderness, axillary adenopathy or supraclavicular adenopathy.    Abdominal:     Palpations: Abdomen is soft. There is no mass.     Tenderness: There is no abdominal tenderness. There is no guarding or rebound.  Musculoskeletal:     Cervical back: Neck supple. No tenderness.  Lymphadenopathy:     Cervical: No cervical adenopathy.  Upper Body:     Right upper body: No supraclavicular, axillary or pectoral adenopathy.     Left upper body: No supraclavicular, axillary or pectoral adenopathy.  Skin:    General: Skin is warm and dry.     Findings: No bruising, erythema, lesion or rash.  Neurological:     Mental Status: She is alert and oriented to person, place, and time.  Psychiatric:        Mood and Affect: Mood normal.        Behavior: Behavior normal.        Thought Content: Thought content normal.        Judgment: Judgment normal.       Assessment and Plan:  Maria Castillo is a 25 y.o. female G0P0000 presenting to the [facility] Department for an yearly wellness and contraception visit  Contraception counseling: Reviewed all forms of birth control options in the tiered based approach. available including abstinence; over the counter/barrier methods; hormonal contraceptive medication including pill, patch, ring, injection,contraceptive implant, ECP; hormonal and nonhormonal IUDs; permanent sterilization options including vasectomy and the various tubal sterilization modalities. Risks, benefits, and typical effectiveness rates were reviewed.  Questions were answered.  Written information was also given to the patient to review.  Patient desires to continue with her current OC, this was prescribed for patient. She will follow up in  1 year and prn for surveillance.  She was told to call with any further questions, or with any concerns about this method of contraception.  Emphasized use of condoms 100% of the time for STI prevention.  Patient was not a candidate for ECP today.    1. Encounter for counseling regarding contraception Reviewed with patient normal SE of current OC and when to call clinic. Enc condoms with all sex.  2. Screening for STD (sexually transmitted disease) Await test results.  Counseled that RN will call if needs to RTC for treatment once results are back.  - Chlamydia/Gonorrhea [place] [facility] - HIV/HCV [place] [facility] - Syphilis Serology, [place] [facility]  3. Well woman exam (no gynecological exam) Reviewed with patient healthy habits to maintain general health. Enc MVI 1 po daily. Enc to establish with/ follow up with PCP for primary care concerns, age appropriate screenings and illness.  4. Surveillance of previously prescribed contraceptive pill Norethindrone 28d 1 po daily at the same time each day, 1 yr supply given to patient. - norethindrone (MICRONOR) 0.35 MG tablet; Take 1 tablet (0.35 mg total) by mouth daily.  Dispense: 28 tablet; Refill: 13     Return in about 1 year (around  02/14/2022) for RP and prn.  No future appointments.  Matt Holmes, PA

## 2021-02-17 NOTE — Progress Notes (Signed)
Chart reviewed by Pharmacist  Suzanne Walker PharmD, Contract Pharmacist at [facility]  

## 2021-02-21 LAB — HM HEPATITIS C SCREENING LAB: HM Hepatitis Screen: NEGATIVE

## 2021-02-21 LAB — HM HIV SCREENING LAB: HM HIV Screening: NEGATIVE

## 2021-07-16 ENCOUNTER — Ambulatory Visit: Payer: Self-pay

## 2021-07-23 ENCOUNTER — Encounter: Payer: Self-pay | Admitting: Advanced Practice Midwife

## 2021-07-23 ENCOUNTER — Other Ambulatory Visit: Payer: Self-pay

## 2021-07-23 ENCOUNTER — Ambulatory Visit: Payer: Self-pay | Admitting: Advanced Practice Midwife

## 2021-07-23 DIAGNOSIS — Z113 Encounter for screening for infections with a predominantly sexual mode of transmission: Secondary | ICD-10-CM

## 2021-07-23 LAB — WET PREP FOR TRICH, YEAST, CLUE
Clue Cell Exam: POSITIVE — AB
Trichomonas Exam: NEGATIVE
Yeast Exam: NEGATIVE

## 2021-07-23 NOTE — Progress Notes (Signed)
[facility] Department STI clinic/screening visit  Subjective:  Maria Castillo is a 25 y.o. SWF nullip nonsmoker female being seen today for an STI screening visit. The patient reports they do not have symptoms.  Patient reports that they do not desire a pregnancy in the next year.   They reported they are not interested in discussing contraception today.  Patient's last menstrual period was 07/14/2021.   Patient has the following medical conditions:   Patient Active Problem List   Diagnosis Date Noted   Migraines 11/18/2019    Chief Complaint  Patient presents with   SEXUALLY TRANSMITTED DISEASE    screening    HPI  Patient reports asymptomatic. LMP 07/21/21. Last sex 07/18/21 without condom; with current partner off and on x 5 years; 2 sex partners in last 3 mo. Last ETOH 07/06/21 (2 shots liquor) 1-2x/mo.  Last HIV test per patient/review of record was 02/21/21 Patient reports last pap was 11/18/19 neg  See flowsheet for further details and programmatic requirements.    The following portions of the patient's history were reviewed and updated as appropriate: allergies, current medications, past medical history, past social history, past surgical history and problem list.  Objective:  There were no vitals filed for this visit.  Physical Exam Vitals and nursing note reviewed.  Constitutional:      Appearance: Normal appearance. She is normal weight.  HENT:     Head: Normocephalic and atraumatic.     Mouth/Throat:     Mouth: Mucous membranes are moist.     Pharynx: Oropharynx is clear. No oropharyngeal exudate or posterior oropharyngeal erythema.  Eyes:     Conjunctiva/sclera: Conjunctivae normal.  Pulmonary:     Effort: Pulmonary effort is normal.  Abdominal:     General: Abdomen is flat.     Palpations: Abdomen is soft. There is no mass.     Tenderness: There is no abdominal tenderness. There is no rebound.     Comments: Soft without masses or tenderness   Genitourinary:    General: Normal vulva.     Exam position: Lithotomy position.     Pubic Area: No rash or pubic lice.      Labia:        Right: No rash or lesion.        Left: No rash or lesion.      Vagina: Vaginal discharge (white creamy leukorrhea, ph<4.5) present. No erythema, bleeding or lesions.     Cervix: Normal.     Uterus: Normal.      Adnexa: Right adnexa normal and left adnexa normal.     Rectum: Normal.  Lymphadenopathy:     Head:     Right side of head: No preauricular or posterior auricular adenopathy.     Left side of head: No preauricular or posterior auricular adenopathy.     Cervical: No cervical adenopathy.     Right cervical: No superficial, deep or posterior cervical adenopathy.    Left cervical: No superficial, deep or posterior cervical adenopathy.     Upper Body:     Right upper body: No supraclavicular or axillary adenopathy.     Left upper body: No supraclavicular or axillary adenopathy.     Lower Body: No right inguinal adenopathy. No left inguinal adenopathy.  Skin:    General: Skin is warm and dry.     Findings: No rash.  Neurological:     Mental Status: She is alert and oriented to person, place, and time.  Assessment and Plan:  Maria Castillo is a 25 y.o. female presenting to the [facility] Department for STI screening  1. Screening examination for venereal disease Treat wet mount per standing orders Immunization nurse consult - WET PREP FOR TRICH, YEAST, CLUE - Gonococcus culture - Chlamydia/Gonorrhea [place] [facility] - HIV [place] [facility] - Syphilis Serology, [place] [facility]     No follow-ups on file.  No future appointments.  Alberteen Spindle, CNM

## 2021-07-23 NOTE — Progress Notes (Signed)
Pt here for STD screening.  Wet mount results reviewed, no treatment required per SO.  Maria Castillo Mikal Blasdell, RN ? ?

## 2021-07-28 LAB — GONOCOCCUS CULTURE

## 2021-08-27 ENCOUNTER — Ambulatory Visit: Payer: Self-pay

## 2022-01-07 ENCOUNTER — Encounter: Payer: Self-pay | Admitting: Nurse Practitioner

## 2022-01-07 ENCOUNTER — Ambulatory Visit: Payer: Self-pay | Admitting: Nurse Practitioner

## 2022-01-07 ENCOUNTER — Other Ambulatory Visit: Payer: Self-pay

## 2022-01-07 VITALS — BP 127/83 | Ht 62.0 in | Wt 148.8 lb

## 2022-01-07 DIAGNOSIS — Z113 Encounter for screening for infections with a predominantly sexual mode of transmission: Secondary | ICD-10-CM

## 2022-01-07 LAB — HM HIV SCREENING LAB: HM HIV Screening: NEGATIVE

## 2022-01-07 LAB — WET PREP FOR TRICH, YEAST, CLUE
Trichomonas Exam: NEGATIVE
Yeast Exam: NEGATIVE

## 2022-01-07 NOTE — Progress Notes (Signed)
Pt's appointment made for refill on birth control pills and an STD screening.   ?Pt states that she still has 2-3 packs of BCP's left.  She will need to return in May, to have a PE and get more refills for her BCP's. ?STD screening done by provider. ?Wet mount results reviewed, no treatment required per SO.  Pt declined condoms.  Berdie Ogren, RN ? ? ?

## 2022-01-07 NOTE — Progress Notes (Signed)
[facility] Department ? ?STI clinic/screening visit ?[address] ?[place] [address] ?[phone] ? ?Subjective:  ?KEYARA ENT is a 26 y.o. female being seen today for an STI screening visit. The patient reports they do not have symptoms.  Patient reports that they do not desire a pregnancy in the next year.   They reported they are not interested in discussing contraception today.   ? ?Patient's last menstrual period was 12/30/2021 (approximate). ? ? ?Patient has the following medical conditions:   ?Patient Active Problem List  ? Diagnosis Date Noted  ? Migraines 11/18/2019  ? ? ?Chief Complaint  ?Patient presents with  ? Contraception  ?  Birth control and STD check  ? ? ?HPI ? ?Patient reports to clinic for an STD screening.  Patient desired a physical and birth control.   ? ?Last HIV test per patient/review of record was 07/23/2021. ?Patient reports last pap was 11/15/2019.  ? ?Screening for MPX risk: ?Does the patient have an unexplained rash? No ?Is the patient MSM? No ?Does the patient endorse multiple sex partners or anonymous sex partners? No ?Did the patient have close or sexual contact with a person diagnosed with MPX? No ?Has the patient traveled outside the [place] where MPX is endemic? No ?Is there a high clinical suspicion for MPX-- evidenced by one of the following No ? -Unlikely to be chickenpox ? -Lymphadenopathy ? -Rash that present in same phase of evolution on any given body part ?See flowsheet for further details and programmatic requirements.  ? ? ?The following portions of the patient's history were reviewed and updated as appropriate: allergies, current medications, past medical history, past social history, past surgical history and problem list. ? ?Objective:  ? ?Vitals:  ? 01/07/22 1317  ?BP: 127/83  ?Weight: 148 lb 12.8 oz (67.5 kg)  ?Height: 5\' 2"  (1.575 m)  ? ? ?Physical Exam ?Constitutional:   ?   Appearance: Normal appearance.  ?HENT:  ?   Head:  Normocephalic.  ?   Right Ear: External ear normal.  ?   Left Ear: External ear normal.  ?   Nose: Nose normal.  ?   Mouth/Throat:  ?   Mouth: Mucous membranes are moist.  ?   Comments: No visible dental caries  ?Pulmonary:  ?   Effort: Pulmonary effort is normal.  ?   Breath sounds: Normal breath sounds.  ?Abdominal:  ?   General: Abdomen is flat.  ?   Palpations: Abdomen is soft.  ?Genitourinary: ?   Comments: External genitalia/pubic area without nits, lice, edema, erythema, lesions and inguinal adenopathy. ?Vagina with normal mucosa and discharge. ?Cervix without visible lesions. ?Uterus firm, mobile, nt, no masses, no CMT, no adnexal tenderness or fullness. pH 4.5. ?Musculoskeletal:  ?   Cervical back: Full passive range of motion without pain, normal range of motion and neck supple.  ?Skin: ?   General: Skin is warm and dry.  ?Neurological:  ?   Mental Status: She is alert and oriented to person, place, and time.  ?Psychiatric:     ?   Attention and Perception: Attention normal.     ?   Mood and Affect: Mood normal.     ?   Speech: Speech normal.     ?   Behavior: Behavior is cooperative.  ? ? ? ?Assessment and Plan:  ?ADELHEID HOGGARD is a 26 y.o. female presenting to the [facility] Department for STI screening ? ?1. Screening examination  for venereal disease ?-26 year old female in clinic today for STD screening.  Patient states she also wanted a physical and more birth control pills.  Patient informed physical not due until 02/17/22.  Will schedule an appointment close to that time.  Patient stated she still had an additional 2 packs of pills left.  Will continue with current packs and receive more at physical appointment.  STD screening provided today.  ?-Patient accepted all screenings including oral, vaginal CT/GC and bloodwork for HIV/RPR.  ?Patient meets criteria for HepB screening? No. Ordered? No - low risk ?Patient meets criteria for HepC screening? No. Ordered? No - low risk  ? ?Treat  wet prep per standing order ? ?Discussed time line for [facility] results and that patient will be called with positive results and encouraged patient to call if she had not heard in 2 weeks.  ?Counseled to return or seek care for continued or worsening symptoms ?Recommended condom use with all sex ? ?Patient is currently using Hormonal Contraception: Injection, Rings and Patches to prevent pregnancy.   ? ?- WET PREP FOR TRICH, YEAST, CLUE ?- Gonococcus culture ?- HIV [place] [facility] ?- Syphilis Serology, [place] [facility] ?- Chlamydia/Gonorrhea [place] [facility]  ? ? ? ? ?Return if symptoms worsen or fail to improve. ? ? ? ?Glenna Fellows, FNP ?

## 2022-01-12 LAB — GONOCOCCUS CULTURE

## 2022-01-14 DIAGNOSIS — Z309 Encounter for contraceptive management, unspecified: Secondary | ICD-10-CM

## 2022-03-31 ENCOUNTER — Ambulatory Visit: Payer: Self-pay

## 2022-04-07 ENCOUNTER — Encounter: Payer: Self-pay | Admitting: Nurse Practitioner

## 2022-04-07 ENCOUNTER — Ambulatory Visit (LOCAL_COMMUNITY_HEALTH_CENTER): Payer: Self-pay | Admitting: Nurse Practitioner

## 2022-04-07 VITALS — BP 121/78 | Ht 62.0 in | Wt 151.8 lb

## 2022-04-07 DIAGNOSIS — B9689 Other specified bacterial agents as the cause of diseases classified elsewhere: Secondary | ICD-10-CM

## 2022-04-07 DIAGNOSIS — Z113 Encounter for screening for infections with a predominantly sexual mode of transmission: Secondary | ICD-10-CM

## 2022-04-07 DIAGNOSIS — Z01419 Encounter for gynecological examination (general) (routine) without abnormal findings: Secondary | ICD-10-CM

## 2022-04-07 DIAGNOSIS — N760 Acute vaginitis: Secondary | ICD-10-CM

## 2022-04-07 DIAGNOSIS — Z3009 Encounter for other general counseling and advice on contraception: Secondary | ICD-10-CM

## 2022-04-07 LAB — WET PREP FOR TRICH, YEAST, CLUE
Trichomonas Exam: NEGATIVE
Yeast Exam: NEGATIVE

## 2022-04-07 LAB — HM HIV SCREENING LAB: HM HIV Screening: NEGATIVE

## 2022-04-07 MED ORDER — METRONIDAZOLE 500 MG PO TABS: 500.0000 mg | ORAL_TABLET | Freq: Two times a day (BID) | ORAL | 0 refills | Status: DC

## 2022-04-07 NOTE — Progress Notes (Signed)
Patient seen for PE, STD screening. Wet prep reviewed, tx given per standing orders. Condoms declined.

## 2022-04-07 NOTE — Progress Notes (Signed)
[facility] DEPARTMENT [facility] [address] Main Number: [phone]    Family Planning Visit- Initial Visit  Subjective:  Maria Castillo is a 26 y.o.  [id number]   being seen today for an initial annual visit and to discuss reproductive life planning.  The patient is currently using Female Condom for pregnancy prevention. Patient reports   does want a pregnancy in the next year.     report they are looking for a method that provides Other mat desire pregnancy within the next year.   Patient has the following medical conditions has Migraines on their problem list.  Chief Complaint  Patient presents with   Annual Exam    Patient reports to clinic today for a physical and STD screening.    Body mass index is 27.76 kg/m. - Patient is eligible for diabetes screening based on BMI and age >52?  not applicable HA1C ordered? not applicable  Patient reports 3  partner/s in last year. Desires STI screening?  Yes  Has patient been screened once for HCV in the past?  No  No results found for: "HCVAB"  Does the patient have current drug use (including MJ), have a partner with drug use, and/or has been incarcerated since last result? No  If yes-- Screen for HCV through [facility]   Does the patient meet criteria for HBV testing? No  Criteria:  -Household, sexual or needle sharing contact with HBV -History of drug use -HIV positive -Those with known Hep C   Health Maintenance Due  Topic Date Due   COVID-19 Vaccine (1) Never done    Review of Systems  Constitutional:  Negative for chills, fever, malaise/fatigue and weight loss.  HENT:  Negative for congestion, hearing loss and sore throat.   Eyes:  Negative for blurred vision, double vision and photophobia.  Respiratory:  Negative for shortness of breath.   Cardiovascular:  Negative for chest pain.  Gastrointestinal:  Negative for abdominal pain, blood in stool, constipation, diarrhea,  heartburn, nausea and vomiting.  Genitourinary:  Negative for dysuria and frequency.       Discharge   Musculoskeletal:  Negative for back pain, joint pain and neck pain.  Skin:  Negative for itching and rash.  Neurological:  Negative for dizziness, weakness and headaches.  Endo/Heme/Allergies:  Does not bruise/bleed easily.  Psychiatric/Behavioral:  Negative for depression, substance abuse and suicidal ideas.     The following portions of the patient's history were reviewed and updated as appropriate: allergies, current medications, past family history, past medical history, past social history, past surgical history and problem list. Problem list updated.   See flowsheet for other program required questions.  Objective:   Vitals:   04/07/22 1052  BP: 121/78  Weight: 151 lb 12.8 oz (68.9 kg)  Height: 5\' 2"  (1.575 m)    Physical Exam Constitutional:      Appearance: Normal appearance.  HENT:     Head: Normocephalic. No abrasion, masses or laceration. Hair is normal.     Jaw: No tenderness or swelling.     Right Ear: External ear normal.     Left Ear: External ear normal.     Nose: Nose normal.     Mouth/Throat:     Lips: Pink. No lesions.     Mouth: Mucous membranes are moist. No lacerations or oral lesions.     Dentition: No dental caries.     Tongue: No lesions.     Palate: No  mass and lesions.     Pharynx: No pharyngeal swelling, oropharyngeal exudate, posterior oropharyngeal erythema or uvula swelling.     Tonsils: No tonsillar exudate or tonsillar abscesses.  Eyes:     Pupils: Pupils are equal, round, and reactive to light.  Neck:     Thyroid: No thyroid mass, thyromegaly or thyroid tenderness.  Cardiovascular:     Rate and Rhythm: Normal rate and regular rhythm.  Pulmonary:     Effort: Pulmonary effort is normal.     Breath sounds: Normal breath sounds.  Abdominal:     General: Abdomen is flat. Bowel sounds are normal.     Palpations: Abdomen is soft.      Tenderness: There is no abdominal tenderness. There is no rebound.  Genitourinary:    Pubic Area: No rash or pubic lice.      Labia:        Right: No rash, tenderness or lesion.        Left: No rash, tenderness or lesion.      Vagina: Normal. No vaginal discharge, erythema, tenderness or lesions.     Cervix: No cervical motion tenderness, discharge, lesion or erythema.     Uterus: Normal.      Adnexa:        Right: No tenderness.         Left: No tenderness.       Rectum: Normal.     Comments: Amount Discharge: small  Odor: No pH: greater than 4.5 Adheres to vaginal wall: No Color: color of discharge matches the Noriko Macari swab  Musculoskeletal:     Cervical back: Full passive range of motion without pain and normal range of motion.  Lymphadenopathy:     Cervical: No cervical adenopathy.     Right cervical: No superficial, deep or posterior cervical adenopathy.    Left cervical: No superficial, deep or posterior cervical adenopathy.     Upper Body:     Right upper body: No supraclavicular, axillary or epitrochlear adenopathy.     Left upper body: No supraclavicular, axillary or epitrochlear adenopathy.     Lower Body: No right inguinal adenopathy. No left inguinal adenopathy.  Skin:    General: Skin is warm and dry.     Findings: No erythema, laceration, lesion or rash.  Neurological:     Mental Status: She is alert and oriented to person, place, and time.  Psychiatric:        Attention and Perception: Attention normal.        Mood and Affect: Mood normal.        Speech: Speech normal.        Behavior: Behavior normal. Behavior is cooperative.      Assessment and Plan:  Maria Castillo is a 26 y.o. female presenting to the [facility] Department for an initial annual wellness/contraceptive visit  Contraception counseling: Reviewed options based on patient desire and reproductive life plan. Patient is interested in Female Condom. This was provided to the patient  today.   Risks, benefits, and typical effectiveness rates were reviewed.  Questions were answered.  Written information was also given to the patient to review.    The patient will follow up in  1 years for surveillance.  The patient was told to call with any further questions, or with any concerns about this method of contraception.  Emphasized use of condoms 100% of the time for STI prevention.  Need for ECP was assessed.  ECP not offered due to  patient desiring pregnancy.  1. Family planning counseling -26 year old female in clinic today for a physical and STD screening.  -ROS reviewed.  Patient reports discharge since May and desires STD screening. -Patient desires to continue with condoms and may desire pregnancy within the next year.    2. Well woman exam with routine gynecological exam -Normal well woman exam. -CBE done in 2022, next due 2025 -PAP done 10/2019, next due 10/2022  3. Screening examination for venereal disease -STD screening today. -Patient accepted all screenings including oral, vaginal CT/GC and bloodwork for HIV/RPR.  Patient meets criteria for HepB screening? No. Ordered? No - low risk Patient meets criteria for HepC screening? No. Ordered? No - low risk   Treat wet prep per standing order Discussed time line for State Lab results and that patient will be called with positive results and encouraged patient to call if she had not heard in 2 weeks.  Counseled to return or seek care for continued or worsening symptoms Recommended condom use with all sex  Patient is currently using  condoms   to prevent pregnancy.    - Chlamydia/Gonorrhea [place] [facility] - Chlamydia/Gonorrhea [place] [facility] - HIV [place] [facility] - Syphilis Serology,  Lab - WET PREP FOR TRICH, YEAST, CLUE   4. Bacterial vaginosis Treat patient for BV.    - metroNIDAZOLE (FLAGYL) 500 MG tablet; Take 1 tablet (500 mg total) by mouth 2 (two) times daily.  Dispense: 14 tablet; Refill: 0     Return in about 1 year (around 04/08/2023) for Annual well-woman exam.  Glenna Fellows, FNP

## 2022-04-08 ENCOUNTER — Encounter: Payer: Self-pay | Admitting: Nurse Practitioner

## 2022-09-19 ENCOUNTER — Ambulatory Visit: Payer: Self-pay

## 2022-10-08 ENCOUNTER — Ambulatory Visit: Payer: Self-pay | Admitting: Family Medicine

## 2022-10-08 ENCOUNTER — Encounter: Payer: Self-pay | Admitting: Family Medicine

## 2022-10-08 DIAGNOSIS — Z113 Encounter for screening for infections with a predominantly sexual mode of transmission: Secondary | ICD-10-CM

## 2022-10-08 LAB — WET PREP FOR TRICH, YEAST, CLUE
Trichomonas Exam: NEGATIVE
Yeast Exam: NEGATIVE

## 2022-10-08 LAB — HM HIV SCREENING LAB: HM HIV Screening: NEGATIVE

## 2022-10-08 NOTE — Progress Notes (Signed)
Pt. seen for routine STI screening. Gram results reviewed with pt by [facility] FNP. Condoms declined, no tx indicated.

## 2022-10-08 NOTE — Progress Notes (Signed)
[facility] Department  STI clinic/screening visit [place] [address] [phone]  Subjective:  Maria Castillo is a 26 y.o. female being seen today for an STI screening visit. The patient reports they do not have symptoms.  Patient reports that they do not desire a pregnancy in the next year.   They reported they are not interested in discussing contraception today.    Patient's last menstrual period was 10/03/2022.   Patient has the following medical conditions:   Patient Active Problem List   Diagnosis Date Noted   Migraines 11/18/2019    Chief Complaint  Patient presents with   SEXUALLY TRANSMITTED DISEASE    STI screening    HPI  Patient reports to clinic for STI screening.   Does the patient using douching products? No  Last HIV test per patient/review of record was  Lab Results  Component Value Date   HMHIVSCREEN Negative - Validated 04/07/2022    Lab Results  Component Value Date   HIV Non reactive 09/03/2018   Patient reports last pap was 10/2019  Screening for MPX risk: Does the patient have an unexplained rash? No Is the patient MSM? No Does the patient endorse multiple sex partners or anonymous sex partners? No Did the patient have close or sexual contact with a person diagnosed with MPX? No Has the patient traveled outside the [place] where MPX is endemic? No Is there a high clinical suspicion for MPX-- evidenced by one of the following No  -Unlikely to be chickenpox  -Lymphadenopathy  -Rash that present in same phase of evolution on any given body part See flowsheet for further details and programmatic requirements.   Immunization history:  Immunization History  Administered Date(s) Administered   Hepatitis A 10/25/2010, 11/26/2011   Hepatitis B November 09, 1997, 03/29/1998, 01/08/1999   Hpv-Unspecified 07/10/2009, 10/25/2010, 11/26/2011   MMR 12/03/1999, 02/08/2003   Meningococcal Conjugate 07/10/2009, 08/07/2014    Tdap 08/03/2020   Varicella 12/03/1999, 07/10/2009     The following portions of the patient's history were reviewed and updated as appropriate: allergies, current medications, past medical history, past social history, past surgical history and problem list.  Objective:  There were no vitals filed for this visit.  Physical Exam Vitals and nursing note reviewed.  Constitutional:      Appearance: Normal appearance.  HENT:     Head: Normocephalic and atraumatic.     Mouth/Throat:     Mouth: Mucous membranes are moist.     Pharynx: Oropharynx is clear. No oropharyngeal exudate or posterior oropharyngeal erythema.  Pulmonary:     Effort: Pulmonary effort is normal.  Abdominal:     General: Abdomen is flat.     Palpations: There is no mass.     Tenderness: There is no abdominal tenderness. There is no rebound.  Genitourinary:    Comments: Patient declined genital exam- wanted to self swab. Lymphadenopathy:     Head:     Right side of head: No preauricular or posterior auricular adenopathy.     Left side of head: No preauricular or posterior auricular adenopathy.     Cervical: No cervical adenopathy.     Upper Body:     Right upper body: No supraclavicular, axillary or epitrochlear adenopathy.     Left upper body: No supraclavicular, axillary or epitrochlear adenopathy.  Skin:    General: Skin is warm and dry.     Findings: No rash.  Neurological:     Mental Status: She is  alert and oriented to person, place, and time.      Assessment and Plan:  Maria Castillo is a 26 y.o. female presenting to the [facility] Department for STI screening  1. Screening for venereal disease  - HIV [place] [facility] - Syphilis Serology, [place], [place], CLUE   Patient accepted all screenings including oral, vaginal CT/GC and bloodwork for HIV/RPR, and wet prep. Patient meets criteria for HepB screening? No. Ordered?  No - not indicated Patient meets criteria for HepC screening? No. Ordered? No - not indicated  Treat wet prep per standing order Discussed time line for State Lab results and that patient will be called with positive results and encouraged patient to call if she had not heard in 2 weeks.  Counseled to return or seek care for continued or worsening symptoms Recommended condom use with all sex  Patient is currently using  female condom  to prevent pregnancy.    Return if symptoms worsen or fail to improve.   Total time spent 20 minutes.  Sharlet Salina, [place]

## 2022-10-13 LAB — GONOCOCCUS CULTURE

## 2022-10-21 ENCOUNTER — Telehealth: Payer: Self-pay

## 2022-10-21 NOTE — Telephone Encounter (Signed)
Calling pt re positive chlamydia result from 10/08/22 vaginal specimen.  Phone call to pt at [phone]. Left message on voicemail that RN with [facility] is calling re TR. Please call Janei Scheff at [phone]. Also sent MyChart message.

## 2022-10-21 NOTE — Telephone Encounter (Signed)
Received return call from pt and pt confirmed password. Counseled pt re +CT result. Pt states NKA and is not taking birth control. Pt further counseled to eat a meal before coming in for tx.  Tx appt scheduled for 10/22/22 (10 min of appt is overbooked on RN 1 schedule; 2 RN schedule).

## 2022-10-22 ENCOUNTER — Ambulatory Visit: Payer: Self-pay

## 2022-10-22 DIAGNOSIS — A749 Chlamydial infection, unspecified: Secondary | ICD-10-CM

## 2022-10-22 MED ORDER — AZITHROMYCIN 500 MG PO TABS
1000.0000 mg | ORAL_TABLET | Freq: Once | ORAL | Status: AC
  Administered 2022-10-22: 1000 mg via ORAL

## 2022-10-22 NOTE — Progress Notes (Signed)
In nurse clinic for chlamydia treatment. Reports LMP 10/03/2022, but also reported last sexual activity was 09/27/2022 and unsure of exact last date of LMP.  Unsure of pregnancy status consulted with Gregary Cromer NP, and Azithromycin in given per standing order.   Advised to contact [facility] if vomits with in 2 hours of taking medication.    Beacher Every Shelda Pal, RN

## 2023-10-22 ENCOUNTER — Encounter: Payer: Self-pay | Admitting: Nurse Practitioner

## 2023-10-22 ENCOUNTER — Ambulatory Visit: Payer: Self-pay | Admitting: Nurse Practitioner

## 2023-10-22 DIAGNOSIS — Z113 Encounter for screening for infections with a predominantly sexual mode of transmission: Secondary | ICD-10-CM

## 2023-10-22 LAB — HEPATITIS B SURFACE ANTIGEN

## 2023-10-22 LAB — WET PREP FOR TRICH, YEAST, CLUE
Trichomonas Exam: NEGATIVE
Yeast Exam: NEGATIVE

## 2023-10-22 LAB — HM HIV SCREENING LAB: HM HIV Screening: NEGATIVE

## 2023-10-22 LAB — HM HEPATITIS C SCREENING LAB: HM Hepatitis Screen: NEGATIVE

## 2023-10-22 NOTE — Progress Notes (Signed)
 [facility] Department STI clinic [address] [place] [address] Main phone: [phone]  STI screening visit  Subjective:  Maria Castillo is a 28 y.o. female being seen today for an STI screening visit. The patient reports they do not have symptoms.  Patient reports that they do not desire a pregnancy in the next year.   They reported they are not interested in discussing contraception today.    Patient's last menstrual period was 09/29/2023 (exact date).  Patient has the following medical conditions:   Patient Active Problem List   Diagnosis Date Noted   Migraines 11/18/2019    Chief Complaint  Patient presents with   SEXUALLY TRANSMITTED DISEASE    No symptoms    Patient is a pleasant 28 y.o. female who presents to the clinic today for asymptomatic STI screening. She indicates one female partner in the past year, practicing vaginal and oral sex, using condoms sometimes, and a history of treated chlamydia 1 year ago.  Patient reports last period was 09/29/23. Last sexual encounter 10/08/23.  Patient indicates she does not want a pregnancy and uses withdrawal method in addition to condoms sometimes for pregnancy prevention.     Does the patient using douching products? No  Last HIV test per patient/review of record was  Lab Results  Component Value Date   HMHIVSCREEN Negative - Validated 10/08/2022    Lab Results  Component Value Date   HIV Non reactive 09/03/2018     Last HEPC test per patient/review of record was  Lab Results  Component Value Date   HMHEPCSCREEN Negative-Validated 02/21/2021   No components found for: HEPC   Last HEPB test per patient/review of record was No components found for: HMHEPBSCREEN   Patient reports last pap was: 11/18/19: NILM, HPV testing not performed. Next PAP past due (11/18/22).   Lab Results  Component Value Date   SPECADGYN Comment 11/18/2019   Result Date Procedure Results  Follow-ups  11/18/2019 Pap IG (Image Guided) DIAGNOSIS:: Comment Specimen adequacy:: Comment Clinician Provided ICD10: Comment Performed by:: Comment PAP Smear Comment: . Note:: Comment Test Methodology: Comment     Screening for MPX risk: Does the patient have an unexplained rash? No Is the patient MSM? No Does the patient endorse multiple sex partners or anonymous sex partners? No Did the patient have close or sexual contact with a person diagnosed with MPX? No Has the patient traveled outside the [place]  where MPX is endemic? No Is there a high clinical suspicion for MPX-- evidenced by one of the following No  -Unlikely to be chickenpox  -Lymphadenopathy  -Rash that present in same phase of evolution on any given body part See flowsheet for further details and programmatic requirements.   Immunization history:  Immunization History  Administered Date(s) Administered   Hepatitis A 10/25/2010, 11/26/2011   Hepatitis B 08/24/98, 03/29/1998, 01/08/1999   Hpv-Unspecified 07/10/2009, 10/25/2010, 11/26/2011   MMR 12/03/1999, 02/08/2003   Meningococcal Conjugate 07/10/2009, 08/07/2014   Tdap 08/03/2020   Varicella 12/03/1999, 07/10/2009     The following portions of the patient's history were reviewed and updated as appropriate: allergies, current medications, past medical history, past social history, past surgical history and problem list.  Objective:  There were no vitals filed for this visit.  Physical Exam Nursing note reviewed.  Constitutional:      Appearance: Normal appearance.  HENT:     Head: Normocephalic.     Salivary Glands: Right salivary gland is not diffusely enlarged  or tender. Left salivary gland is not diffusely enlarged or tender.     Mouth/Throat:     Lips: Pink. No lesions.     Mouth: Mucous membranes are moist.     Tongue: No lesions. Tongue does not deviate from midline.     Pharynx: Oropharynx is clear. Uvula midline. No oropharyngeal exudate or  posterior oropharyngeal erythema.     Tonsils: No tonsillar exudate.  Eyes:     General:        Right eye: No discharge.        Left eye: No discharge.  Pulmonary:     Effort: Pulmonary effort is normal.  Genitourinary:    Comments: Patient asymptomatic. Self-collected vaginal swabs.  Lymphadenopathy:     Head:     Right side of head: No submental, submandibular, tonsillar, preauricular or posterior auricular adenopathy.     Left side of head: No submental, submandibular, tonsillar, preauricular or posterior auricular adenopathy.     Cervical: No cervical adenopathy.     Right cervical: No superficial or posterior cervical adenopathy.    Left cervical: No superficial or posterior cervical adenopathy.     Upper Body:     Right upper body: No supraclavicular or axillary adenopathy.     Left upper body: No supraclavicular or axillary adenopathy.  Skin:    General: Skin is warm and dry.     Findings: No rash.     Comments: Skin tone appropriate for ethnicity. Exposed areas and back only.   Neurological:     Mental Status: She is alert and oriented to person, place, and time.  Psychiatric:        Attention and Perception: Attention normal.        Mood and Affect: Mood and affect normal.        Speech: Speech normal.        Behavior: Behavior normal. Behavior is cooperative.        Thought Content: Thought content normal.     Assessment and Plan:  Maria Castillo is a 28 y.o. female presenting to the [facility] Department for STI screening  1. Screening for venereal disease (Primary)  - Chlamydia/Gonorrhea [place] Lab - HBV Antigen/Antibody [facility] Lab - HIV/HCV [place] [facility] - Syphilis Serology, [place] Lab - WET PREP FOR TRICH, YEAST, CLUE - Gonococcus culture   Patient accepted all screenings including oral, vaginal CT/GC and bloodwork for HIV/RPR, and wet prep. Patient meets criteria for HepB screening? Yes. Ordered? yes Patient meets criteria for HepC  screening? Yes. Ordered? yes  Treat wet prep per standing order Discussed time line for State Lab results and that patient will be called with positive results and encouraged patient to call if she had not heard in 2 weeks.  Counseled to return or seek care for continued or worsening symptoms Recommended repeat testing in 3 months with positive results. Recommended condom use with all sex for STI and pregnancy prevention.   Patient is currently using  withdrawal method and condoms sometimes  to prevent pregnancy.    Discussed Alan Hail, LCSW services with patient. Patient indicated she would contemplate for now.   Return if symptoms worsen or fail to improve.  No future appointments.  Total time with patient 30 minutes.   Maria LITTIE Narrow, NP

## 2023-10-22 NOTE — Progress Notes (Signed)
 Pt is here for std screening, wet prep results reviewed no treatment per standing order. Condoms declined. Gaspar Garbe, RN

## 2023-10-28 LAB — GONOCOCCUS CULTURE

## 2023-11-03 ENCOUNTER — Telehealth: Payer: Self-pay

## 2023-11-03 NOTE — Telephone Encounter (Signed)
 PT is seeking HPV results clarification

## 2024-06-23 ENCOUNTER — Ambulatory Visit: Payer: Self-pay

## 2024-06-29 ENCOUNTER — Ambulatory Visit (LOCAL_COMMUNITY_HEALTH_CENTER): Payer: Self-pay

## 2024-06-29 VITALS — BP 121/77 | HR 64 | Ht 63.0 in | Wt 163.2 lb

## 2024-06-29 DIAGNOSIS — Z113 Encounter for screening for infections with a predominantly sexual mode of transmission: Secondary | ICD-10-CM

## 2024-06-29 DIAGNOSIS — Z124 Encounter for screening for malignant neoplasm of cervix: Secondary | ICD-10-CM

## 2024-06-29 DIAGNOSIS — N920 Excessive and frequent menstruation with regular cycle: Secondary | ICD-10-CM

## 2024-06-29 DIAGNOSIS — Z3009 Encounter for other general counseling and advice on contraception: Secondary | ICD-10-CM

## 2024-06-29 LAB — WET PREP FOR TRICH, YEAST, CLUE
Clue Cell Exam: NEGATIVE
Trichomonas Exam: NEGATIVE
Yeast Exam: NEGATIVE

## 2024-06-29 NOTE — Progress Notes (Signed)
 [employer] [facility] DEPART[facility] [address] [place] [address] Main phone: [phone]  Family Planning Visit - Repeat Yearly Visit  Subjective:  Maria Castillo is a 28 y.o. G0P0000  being seen today for an annual wellness visit and to discuss contraception options. The patient is currently using no method - no contraceptive precautions for pregnancy prevention. Patient desires a pregnancy in next several years.  Patient has the following medical problems:  Patient Active Problem List   Diagnosis Date Noted   Migraines 11/18/2019   Chief Complaint  Patient presents with   Annual Exam    Pt is here PE and pap   HPI Patient reports problems with her period. Previously she has a period every 4-5 weeks, last 2 months has bled every 2-3 weeks/periods have become closer together. Bleeding lasts 4-5 days. Last period on 05/27/24 was 7-8 days and heavier. Not very painful, first day has some cramps. Also endorses some spotting between periods.  Endorses fatigue, sleeps well but still feels tired.  No major medical problems. No major stressors in last few months.  Also feels like she gets BV frequently, about every other month. Uses boric acid suppositories and it clears up her odor. No sx today. Due for pap test, NILM in 2021.  Review of Systems  All other systems reviewed and are negative.  See flowsheet for further details and programmatic requirements Hyperlink available at the top of the signed note in blue.  Flow sheet content below:  Pregnancy Intention Screening Does the patient want to become pregnant in the next year?: Yes Does the patient's partner want to become pregnant in the next year?: Yes Would the patient like to discuss contraceptive options today?: No Other:  Password: 15 Sexual History What age did you start your period?: 12 How often do you have your period?: monthly Date of last sex?: 06/22/24 Has the  patient had unprotected sex within the last 5 days?: No Do you have sex with men, women, both men and women?: Men only In the past 2 months how many partners have you had sex with?: 1 In the past 12 months, how many partners have you had sex with?: 1 Is it possible that any of your sex partners in the past 12 months had sex with someone else whild they were still in a sexual relationship with you?: No What ways do you have sex?: Vaginal, Oral Do you or your partner use condoms and/or dental dams every time you have vaginal, oral or anal sex?: Sometimes Do you douche?: No Date of last HIV test?: 10/22/23 Have you ever had an STD?: Yes Have any of your partners had an STD?: Yes Partner Previous STD?: Chlamydia Date?:  (2023) Have you or your partner ever shot up drugs?: No Have any of your partners used drugs in the past?: No Have you or your partners exchanged money or drugs for sex?: No  Diabetes screening This patient is 28 y.o. with a BMI of Body mass index is 28.91 kg/m.Maria Castillo  Is patient eligible for diabetes screening (age >35 and BMI >25)?  no  Was Hgb A1c ordered? no  STI screening Patient reports 1 of partners in last year.  Does this patient desire STI screening?  Yes Declines all blood tests.  Cervical Cancer Screening  Result Date Procedure Results Follow-ups  11/18/2019 Pap IG (Image Guided) DIAGNOSIS:: Comment Specimen adequacy:: Comment Clinician Provided ICD10: Comment Performed by:: Comment PAP Smear Comment: .  Note:: Comment Test Methodology: Comment    Health Maintenance Due  Topic Date Due   Cervical Cancer Screening (Pap smear)  11/17/2022   Influenza Vaccine  Never done   COVID-19 Vaccine (1 - 2024-25 season) Never done   The following portions of the patient's history were reviewed and updated as appropriate: allergies, current medications, past family history, past medical history, past social history, past surgical history and problem list. Problem list  updated.  Objective:   Vitals:   06/29/24 1008  BP: 121/77  Pulse: 64  Weight: 163 lb 3.2 oz (74 kg)  Height: 5' 3 (1.6 m)   Physical Exam Vitals and nursing note reviewed. Exam conducted with a chaperone present Brett Orange).  Constitutional:      Appearance: Normal appearance.  HENT:     Head: Normocephalic and atraumatic.     Mouth/Throat:     Mouth: Mucous membranes are moist.     Pharynx: Oropharynx is clear. No oropharyngeal exudate or posterior oropharyngeal erythema.  Pulmonary:     Effort: Pulmonary effort is normal.  Abdominal:     General: Abdomen is flat.     Palpations: Abdomen is soft. There is no mass.     Tenderness: There is no abdominal tenderness. There is no rebound.  Genitourinary:    General: Normal vulva.     Exam position: Lithotomy position.     Pubic Area: No rash or pubic lice.      Labia:        Right: No rash or lesion.        Left: No rash or lesion.      Vagina: Vaginal discharge present. No erythema, bleeding or lesions.     Cervix: Friability and cervical bleeding present. No cervical motion tenderness, discharge, lesion or erythema.     Uterus: Normal.      Adnexa: Right adnexa normal and left adnexa normal.     Rectum: Normal.     Comments: pH = <4.5 Bleeding with pap test  Lymphadenopathy:     Cervical: No cervical adenopathy.     Right cervical: No superficial or posterior cervical adenopathy.    Left cervical: No superficial or posterior cervical adenopathy.     Upper Body:     Right upper body: No supraclavicular, axillary or epitrochlear adenopathy.     Left upper body: No supraclavicular, axillary or epitrochlear adenopathy.     Lower Body: No right inguinal adenopathy. No left inguinal adenopathy.  Skin:    General: Skin is warm and dry.     Findings: No rash.  Neurological:     Mental Status: She is alert and oriented to person, place, and time.    Assessment and Plan:  Maria Castillo is a 28 y.o. female G0P0000  presenting to the [facility] Department for an yearly wellness and contraception visit  1. Family planning (Primary)  Contraception counseling:  Reviewed options based on patient desire and reproductive life plan. Patient is interested in No Method - No Contraceptive Precautions. Desires pregnancy in next few years, but she would be okay if she became pregnant sooner. Advised initiation of a prenatal vitamin.  Risks, benefits, and typical effectiveness rates were reviewed.  Questions were answered.  Written information was also given to the patient to review.    The patient was told to call with any further questions, or with any concerns about this method of contraception.  Emphasized use of condoms 100% of the time for STI  prevention.  2. Screening for cervical cancer  - IGP, rfx Aptima HPV ASCU  3. Screening for venereal disease  - WET PREP FOR TRICH, YEAST, CLUE - Chlamydia/Gonorrhea [facility]  4. Unusually frequent menses - Last 3 periods were 2-3 weeks apart - LMP was 05/27/24 and that was a heavier period than usual, 7-8 days long - Periods are not painful, mild cramps on day   5. Spotting between menses - Will spot sometimes a few days after sex  - Discussed that we will rule out infectious as a cause of her spotting. Cervix friable on exam and bled w/ her pap test. Normal bimanual exam. No cervical polyps, uterine enlargement. - Discussed if increased frequency of menses and spotting continues we should consider pelvic imaging/referral. She does not have insurance at this time. - Will await results of g/c swab. She will call provider if she decides she wants referral to [facility]. - Intended to get a pregnancy test while she was here, but it was overlooked. Called patient and she was unable to return to [facility]. Patient will take a pregnancy test at home and call if positive.  Return if symptoms worsen or fail to improve.  No future appointments.  Damien FORBES Satchel, NP

## 2024-06-29 NOTE — Progress Notes (Signed)
 Pt is here for PE. Wet prep results reviewed with patient and requires no treatment per provider. Condoms declined. Wilkie Drought, RN.

## 2024-07-13 ENCOUNTER — Ambulatory Visit: Payer: Self-pay | Admitting: Family Medicine

## 2024-07-13 LAB — IGP, RFX APTIMA HPV ASCU: PAP Smear Comment: 0

## 2024-07-13 LAB — HPV APTIMA: HPV Aptima: NEGATIVE

## 2024-07-13 NOTE — Progress Notes (Signed)
 PAP smear reviewed, result: ASC-US , HPV negative. Based on this result and patient's prior cervical cancer screenings, [facility] currently recommends repeat PAP smear in 3 years.  Dorothyann Helling, MD 07/13/24  1:45 PM
